# Patient Record
Sex: Female | Born: 1965 | Hispanic: Refuse to answer | Marital: Married | Smoking: Never smoker
Health system: Southern US, Community
[De-identification: ages and names within clinical notes are randomized; demographics above are authoritative.]

## PROBLEM LIST (undated history)

## (undated) DIAGNOSIS — F419 Anxiety disorder, unspecified: Secondary | ICD-10-CM

## (undated) DIAGNOSIS — G43909 Migraine, unspecified, not intractable, without status migrainosus: Secondary | ICD-10-CM

## (undated) DIAGNOSIS — R5383 Other fatigue: Secondary | ICD-10-CM

## (undated) DIAGNOSIS — J45909 Unspecified asthma, uncomplicated: Secondary | ICD-10-CM

## (undated) DIAGNOSIS — R42 Dizziness and giddiness: Secondary | ICD-10-CM

## (undated) DIAGNOSIS — L719 Rosacea, unspecified: Secondary | ICD-10-CM

## (undated) DIAGNOSIS — T7840XA Allergy, unspecified, initial encounter: Secondary | ICD-10-CM

## (undated) DIAGNOSIS — D649 Anemia, unspecified: Secondary | ICD-10-CM

## (undated) DIAGNOSIS — N200 Calculus of kidney: Secondary | ICD-10-CM

## (undated) DIAGNOSIS — E785 Hyperlipidemia, unspecified: Secondary | ICD-10-CM

## (undated) DIAGNOSIS — N390 Urinary tract infection, site not specified: Secondary | ICD-10-CM

## (undated) DIAGNOSIS — F32A Depression, unspecified: Secondary | ICD-10-CM

## (undated) DIAGNOSIS — F329 Major depressive disorder, single episode, unspecified: Secondary | ICD-10-CM

## (undated) HISTORY — DX: Unspecified asthma, uncomplicated: J45.909

## (undated) HISTORY — DX: Urinary tract infection, site not specified: N39.0

## (undated) HISTORY — DX: Allergy, unspecified, initial encounter: T78.40XA

## (undated) HISTORY — DX: Rosacea, unspecified: L71.9

## (undated) HISTORY — DX: Hyperlipidemia, unspecified: E78.5

## (undated) HISTORY — DX: Migraine, unspecified, not intractable, without status migrainosus: G43.909

## (undated) HISTORY — DX: Anemia, unspecified: D64.9

## (undated) HISTORY — DX: Dizziness and giddiness: R42

## (undated) HISTORY — DX: Other fatigue: R53.83

## (undated) HISTORY — PX: BREAST EXCISIONAL BIOPSY: SUR124

## (undated) HISTORY — DX: Calculus of kidney: N20.0

## (undated) HISTORY — DX: Depression, unspecified: F32.A

## (undated) HISTORY — DX: Major depressive disorder, single episode, unspecified: F32.9

## (undated) HISTORY — DX: Anxiety disorder, unspecified: F41.9

## (undated) HISTORY — PX: WISDOM TOOTH EXTRACTION: SHX21

---
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
## 1988-02-18 HISTORY — PX: BREAST BIOPSY: SHX20

## 1999-02-18 HISTORY — PX: LASIK: SHX215

## 2016-05-28 ENCOUNTER — Encounter: Payer: Self-pay | Admitting: Family Medicine

## 2016-05-28 ENCOUNTER — Ambulatory Visit (INDEPENDENT_AMBULATORY_CARE_PROVIDER_SITE_OTHER): Payer: PRIVATE HEALTH INSURANCE | Admitting: Family Medicine

## 2016-05-28 VITALS — BP 116/70 | HR 91 | Temp 98.8°F | Resp 20 | Ht 64.5 in | Wt 137.8 lb

## 2016-05-28 DIAGNOSIS — F410 Panic disorder [episodic paroxysmal anxiety] without agoraphobia: Secondary | ICD-10-CM | POA: Diagnosis not present

## 2016-05-28 DIAGNOSIS — Z7689 Persons encountering health services in other specified circumstances: Secondary | ICD-10-CM | POA: Diagnosis not present

## 2016-05-28 DIAGNOSIS — F419 Anxiety disorder, unspecified: Secondary | ICD-10-CM | POA: Insufficient documentation

## 2016-05-28 DIAGNOSIS — E785 Hyperlipidemia, unspecified: Secondary | ICD-10-CM

## 2016-05-28 DIAGNOSIS — N390 Urinary tract infection, site not specified: Secondary | ICD-10-CM | POA: Diagnosis not present

## 2016-05-28 DIAGNOSIS — F411 Generalized anxiety disorder: Secondary | ICD-10-CM

## 2016-05-28 MED ORDER — PAROXETINE HCL 20 MG PO TABS: 20.0000 mg | ORAL_TABLET | Freq: Every day | ORAL | 0 refills | Status: DC

## 2016-05-28 NOTE — Progress Notes (Signed)
Patient ID: Carla Beltran, female  DOB: 1965-11-17, 51 y.o.   MRN: [id number] Patient Care Team    Relationship Specialty Notifications Start End  [name], DO PCP - General Family Medicine  05/28/16     Subjective:  Carla Beltran is a 51 y.o.  female present for new patient establishment. All past medical history, surgical history, allergies, family history, immunizations, medications and social history were obtained and entered in the electronic medical record today. All recent labs, ED visits and hospitalizations within the last year were reviewed.  Anxiety/phobia: Patient presents for establishment today and admits to increased anxiety and panic attacks. She has fears that she has trouble controlling. She worries about everything. She has a strong fear of driving on the [place], especially in areas she is unfamiliar with. She endorses panic attacks that occur during driving and at other times. She states she experiences a increased heart rate and then feeling like she is "out of her body ". She reports increased anxiety in her life especially around times when she is moving, such as currently. She states she's always had anxiety. She reports she did not have a great childhood. Her mother now has dementia and is in [place] in a nursing facility, this also caused her increased anxiety. Her mother with hoarder. She has 2 daughters, which also are affected by anxiety and are on Celexa. She states her daughters are not affected by his much panic she is. She reports being on Zoloft since 2006. She was initially on 25 mg of Zoloft daily, and eventually increased up to Zoloft 50 mg daily. Prior to moving in August 2017 her prior provider told her to increase to 75 mg daily. She states that she did not like the apathetic side effects she experienced at 75 mg dose, so she decreased back down to the 50 mg dose daily. She reports being seen by counselor many times in the past. She has seek the  help of a Marketing executive in [place] Leanor Rubenstein). She has yet to establish with her.  Hyperlipidemia: Patient reports a history of hyperlipidemia she brings past cholesterol checks from 2002- 2016. She states her cholesterol is high, and she has a lot of anxiety surrounding medication use for cholesterol. She had put off being established and a primary care office after moving, secondary to fear of being started on a statin. She reports taking a fish oil-like substance that was planned paste and during those times her cholesterol was well controlled. Her last cholesterol panel was September 2016 with a total cholesterol 271, HDL 76, LDL 177, triglycerides 89, fasting blood sugar 82. She states she tries to control her cholesterol with her diet.  Frequent UTI: Patient reports she has frequent UTI, and has been prescribed by her prior primary care physician 1 Macrobid 100 mg after intercourse. She feels this is effective for her. She states she does not need refills today, but will call in once needed.  Mood disorder screening: negative.   Depression screen PHQ 2/9 05/28/2016  Decreased Interest 1  Down, Depressed, Hopeless 1  PHQ - 2 Score 2  Altered sleeping 0  Tired, decreased energy 0  Change in appetite 0  Feeling bad or failure about yourself  1  Trouble concentrating 0  Moving slowly or fidgety/restless 0  Suicidal thoughts 0  PHQ-9 Score 3   GAD 7 : Generalized Anxiety Score 05/28/2016  Nervous, Anxious, on Edge 1  Control/stop worrying 1  Worry  too much - different things 1  Trouble relaxing 1  Restless 0  Easily annoyed or irritable 0  Afraid - awful might happen 0  Total GAD 7 Score 4  Anxiety Difficulty Very difficult     Current Exercise Habits: Home exercise routine, Type of exercise: walking, Time (Minutes): 45, Frequency (Times/Week): 4, Weekly Exercise (Minutes/Week): 180, Intensity: Mild    There is no immunization history on file for this  patient.   Past Medical History:  Diagnosis Date  . Anxiety   . Frequent UTI   . Hyperlipidemia   . Migraine   . Vertigo    No Known Allergies Past Surgical History:  Procedure Laterality Date  . BREAST BIOPSY  1990   benign  . TONSILLECTOMY AND ADENOIDECTOMY  1985   Family History  Problem Relation Age of Onset  . Diabetes Mother   . Mental illness Mother     Hoarder  . Dementia Mother   . Atrial fibrillation Father   . Diabetes Maternal Aunt   . Hearing loss Maternal Aunt   . Stroke Maternal Grandmother   . Arthritis Maternal Grandfather   . Heart disease Maternal Grandfather    Social History   Social History  . Marital status: Married    Spouse name: [name] Reading  . Number of children: 2  . Years of education: 14   Occupational History  . [employer]    Social History Main Topics  . Smoking status: Never Smoker  . Smokeless tobacco: Never Used  . Alcohol use No  . Drug use: No  . Sexual activity: Yes    Partners: Male    Birth control/ protection: Surgical     Comment: Married; husband vasectomy   Other Topics Concern  . Not on file   Social History Narrative   Married to [place]. Two adult children [name] and [name] (in college).    Moved from [place] [facility]  area) 09/2015.    SAHM. College educated.    Drinks one cup of coffee a day.   Takes a daily vitamin.   Wears seatbelts, bicycle helmet and smoke detector in the home.   Tries to exercise routinely.   Feels safe in her relationships.   Allergies as of 05/28/2016   No Known Allergies     Medication List       Accurate as of 05/28/16  4:28 PM. Always use your most recent med list.          nitrofurantoin (macrocrystal-monohydrate) 100 MG capsule Commonly known as:  MACROBID Take 100 mg by mouth as needed. 1 capsule after intercourse to prevent UTI   PARoxetine 20 MG tablet Commonly known as:  PAXIL Take 1 tablet (20 mg total) by mouth daily.        No results found for this or any  previous visit (from the past 2160 hour(s)).  Patient was never admitted.  ROS: 14 pt review of systems performed and negative (unless mentioned in an HPI)  Objective: BP 116/70 (BP Location: Right Arm, Patient Position: Sitting, Cuff Size: Normal)   Pulse 91   Temp 98.8 F (37.1 C)   Resp 20   Ht 5' 4.5" (1.638 m)   Wt 137 lb 12 oz (62.5 kg)   SpO2 99%   BMI 23.28 kg/m  Gen: Afebrile. No acute distress. Nontoxic in appearance, well-developed, well-nourished,  Extremely tearful Caucasian female. HENT: AT. [place]. MMM Eyes:Pupils Equal Round Reactive to light, Extraocular movements intact,  Conjunctiva without redness, discharge or  icterus. Neck/lymp/endocrine: Supple, no lymphadenopathy CV: RRR, no edema Chest: CTAB, no wheeze, rhonchi or crackles.  Abd: Soft. NTND. BS present Skin: Warm and well-perfused. Skin intact. Neuro/Msk: Normal gait. PERLA. EOMi. Alert. Oriented x3.   Psych: Patient is extremely tearful  and upset. Has difficulty speaking through tears. Psychomotor agitation present. Severe anxiety. Normal speech. Normal thought content and judgment.   Assessment/plan: Carla Beltran is a 51 y.o. female present for establish care Frequent UTI Patient on Macrobid 100 mg after intercourse. She does not need refills on this today.  Generalized anxiety disorder/panic attacks - Discussed general anxiety and panic attacks in great detail today. Patient is extremely anxious in the office today. Zoloft does not appear to be effective at all for her at this time, and she cannot tolerate increased dose of the medication. Discussed with her many options that we can try for her. Options were discussed and she was allowed to ask many questions, all which were answered for her today. She does not desire to take much medication of any type. She is agreeable to stop Zoloft and start Paxil. - She will establish with the [place] counselor. - Patient was given breathing exercises to attempt  during high anxiety times and when driving if necessary. - PARoxetine (PAXIL) 20 MG tablet; Take 1 tablet (20 mg total) by mouth daily.  Dispense: 30 tablet; Refill: 0 - Follow-up in 4 weeks, will consider increasing Paxil at that time if needed.  Hyperlipidemia, unspecified hyperlipidemia type - Patient has a fear of statin medications. Her last lipid panel was elevated with an LDL of 177. She does have family history. Encouraged her to at least start fish oil supplementation and we will follow-up with her cholesterol during her physical. Would await for the rest of her medical records to be received prior to scheduling physical, if possible.   Return in about 4 weeks (around 06/25/2016) for GAD.  Greater than 45 minutes was spent with patient, greater than 50% of that time was spent face-to-face with patient counseling and/or coordinating care.    Electronically signed by: Howard Pouch, DO [place]

## 2016-05-28 NOTE — Patient Instructions (Signed)
It was a pleasure meeting you today.  I do recommend you get a hold of the christian counselor you spoke about today.  Start paxil 1 pilll daily, stop zoloft.    Generalized Anxiety Disorder, Adult Generalized anxiety disorder (GAD) is a mental health disorder. People with this condition constantly worry about everyday events. Unlike normal anxiety, worry related to GAD is not triggered by a specific event. These worries also do not fade or get better with time. GAD interferes with life functions, including relationships, work, and school. GAD can vary from mild to severe. People with severe GAD can have intense waves of anxiety with physical symptoms (panic attacks). What are the causes? The exact cause of GAD is not known. What increases the risk? This condition is more likely to develop in:  Women.  People who have a family history of anxiety disorders.  People who are very shy.  People who experience very stressful life events, such as the death of a loved one.  People who have a very stressful family environment. What are the signs or symptoms? People with GAD often worry excessively about many things in their lives, such as their health and family. They may also be overly concerned about:  Doing well at work.  Being on time.  Natural disasters.  Friendships. Physical symptoms of GAD include:  Fatigue.  Muscle tension or having muscle twitches.  Trembling or feeling shaky.  Being easily startled.  Feeling like your heart is pounding or racing.  Feeling out of breath or like you cannot take a deep breath.  Having trouble falling asleep or staying asleep.  Sweating.  Nausea, diarrhea, or irritable bowel syndrome (IBS).  Headaches.  Trouble concentrating or remembering facts.  Restlessness.  Irritability. How is this diagnosed? Your health care provider can diagnose GAD based on your symptoms and medical history. You will also have a physical exam. The  health care provider will ask specific questions about your symptoms, including how severe they are, when they started, and if they come and go. Your health care provider may ask you about your use of alcohol or drugs, including prescription medicines. Your health care provider may refer you to a mental health specialist for further evaluation. Your health care provider will do a thorough examination and may perform additional tests to rule out other possible causes of your symptoms. To be diagnosed with GAD, a person must have anxiety that:  Is out of his or her control.  Affects several different aspects of his or her life, such as work and relationships.  Causes distress that makes him or her unable to take part in normal activities.  Includes at least three physical symptoms of GAD, such as restlessness, fatigue, trouble concentrating, irritability, muscle tension, or sleep problems. Before your health care provider can confirm a diagnosis of GAD, these symptoms must be present more days than they are not, and they must last for six months or longer. How is this treated? The following therapies are usually used to treat GAD:  Medicine. Antidepressant medicine is usually prescribed for long-term daily control. Antianxiety medicines may be added in severe cases, especially when panic attacks occur.  Talk therapy (psychotherapy). Certain types of talk therapy can be helpful in treating GAD by providing support, education, and guidance. Options include:  Cognitive behavioral therapy (CBT). People learn coping skills and techniques to ease their anxiety. They learn to identify unrealistic or negative thoughts and behaviors and to replace them with positive ones.  Acceptance and commitment therapy (ACT). This treatment teaches people how to be mindful as a way to cope with unwanted thoughts and feelings.  Biofeedback. This process trains you to manage your body's response (physiological  response) through breathing techniques and relaxation methods. You will work with a therapist while machines are used to monitor your physical symptoms.  Stress management techniques. These include yoga, meditation, and exercise. A mental health specialist can help determine which treatment is best for you. Some people see improvement with one type of therapy. However, other people require a combination of therapies. Follow these instructions at home:  Take over-the-counter and prescription medicines only as told by your health care provider.  Try to maintain a normal routine.  Try to anticipate stressful situations and allow extra time to manage them.  Practice any stress management or self-calming techniques as taught by your health care provider.  Do not punish yourself for setbacks or for not making progress.  Try to recognize your accomplishments, even if they are small.  Keep all follow-up visits as told by your health care provider. This is important. Contact a health care provider if:  Your symptoms do not get better.  Your symptoms get worse.  You have signs of depression, such as:  A persistently sad, cranky, or irritable mood.  Loss of enjoyment in activities that used to bring you joy.  Change in weight or eating.  Changes in sleeping habits.  Avoiding friends or family members.  Loss of energy for normal tasks.  Feelings of guilt or worthlessness. Get help right away if:  You have serious thoughts about hurting yourself or others. If you ever feel like you may hurt yourself or others, or have thoughts about taking your own life, get help right away. You can go to your nearest emergency department or call:  Your local emergency services (911 in the [place]  A suicide crisis helpline, such as the [facility] at 410-515-3060. This is open 24 hours a day. Summary  Generalized anxiety disorder (GAD) is a mental health disorder that  involves worry that is not triggered by a specific event.  People with GAD often worry excessively about many things in their lives, such as their health and family.  GAD may cause physical symptoms such as restlessness, trouble concentrating, sleep problems, frequent sweating, nausea, diarrhea, headaches, and trembling or muscle twitching.  A mental health specialist can help determine which treatment is best for you. Some people see improvement with one type of therapy. However, other people require a combination of therapies. This information is not intended to replace advice given to you by your health care provider. Make sure you discuss any questions you have with your health care provider. Document Released: 05/31/2012 Document Revised: 12/25/2015 Document Reviewed: 12/25/2015 Elsevier Interactive Patient Education  2017 [employer].

## 2016-06-18 ENCOUNTER — Ambulatory Visit: Payer: PRIVATE HEALTH INSURANCE | Admitting: Family Medicine

## 2016-06-25 ENCOUNTER — Ambulatory Visit (INDEPENDENT_AMBULATORY_CARE_PROVIDER_SITE_OTHER): Payer: PRIVATE HEALTH INSURANCE | Admitting: Family Medicine

## 2016-06-25 ENCOUNTER — Encounter: Payer: Self-pay | Admitting: Family Medicine

## 2016-06-25 VITALS — BP 113/70 | HR 78 | Temp 98.1°F | Resp 20 | Ht 65.0 in | Wt 136.8 lb

## 2016-06-25 DIAGNOSIS — F410 Panic disorder [episodic paroxysmal anxiety] without agoraphobia: Secondary | ICD-10-CM

## 2016-06-25 DIAGNOSIS — F411 Generalized anxiety disorder: Secondary | ICD-10-CM

## 2016-06-25 MED ORDER — PAROXETINE HCL 20 MG PO TABS: 20.0000 mg | ORAL_TABLET | Freq: Every day | ORAL | 0 refills | Status: DC

## 2016-06-25 NOTE — Progress Notes (Signed)
Patient ID: Carla Beltran, female  DOB: 08/29/1965, 51 y.o.   MRN: [id number] Patient Care Team    Relationship Specialty Notifications Start End  [name], DO PCP - General Family Medicine  05/28/16     Subjective:  Carla Beltran is a 51 y.o.  female present for follow up on   Chief Complaint  Patient presents with  . Anxiety     Anxiety/phobia:  Patient doing well on Paxil 20 mg QD. She denies any side effects except dizziness, that resolved and decrease in sexual desire (which she is ok with). She feels her emotions and anxiety are much improved on the medication. She would like to continue at current dose.   Prior note:  Patient presents for establishment today and admits to increased anxiety and panic attacks. She has fears that she has trouble controlling. She worries about everything. She has a strong fear of driving on the [place], especially in areas she is unfamiliar with. She endorses panic attacks that occur during driving and at other times. She states she experiences a increased heart rate and then feeling like she is "out of her body ". She reports increased anxiety in her life especially around times when she is moving, such as currently. She states she's always had anxiety. She reports she did not have a great childhood. Her mother now has dementia and is in [place] in a nursing facility, this also caused her increased anxiety. Her mother with hoarder. She has 2 daughters, which also are affected by anxiety and are on Celexa. She states her daughters are not affected by his much panic she is. She reports being on Zoloft since 2006. She was initially on 25 mg of Zoloft daily, and eventually increased up to Zoloft 50 mg daily. Prior to moving in August 2017 her prior provider told her to increase to 75 mg daily. She states that she did not like the apathetic side effects she experienced at 75 mg dose, so she decreased back down to the 50 mg dose daily. She  reports being seen by counselor many times in the past. She has seek the help of a Marketing executive in [place] Leanor Rubenstein). She has yet to establish with her.  Mood disorder screening: negative.   Depression screen [name] 2/9 06/25/2016 05/28/2016  Decreased Interest 0 1  Down, Depressed, Hopeless 0 1  PHQ - 2 Score 0 2  Altered sleeping - 0  Tired, decreased energy - 0  Change in appetite - 0  Feeling bad or failure about yourself  - 1  Trouble concentrating - 0  Moving slowly or fidgety/restless - 0  Suicidal thoughts - 0  PHQ-9 Score - 3   GAD 7 : Generalized Anxiety Score 05/28/2016  Nervous, Anxious, on Edge 1  Control/stop worrying 1  Worry too much - different things 1  Trouble relaxing 1  Restless 0  Easily annoyed or irritable 0  Afraid - awful might happen 0  Total GAD 7 Score 4  Anxiety Difficulty Very difficult         Immunization History  Administered Date(s) Administered  . Tdap 10/26/2012     Past Medical History:  Diagnosis Date  . Anxiety   . Frequent UTI   . Hyperlipidemia   . Migraine   . Vertigo    No Known Allergies Past Surgical History:  Procedure Laterality Date  . BREAST BIOPSY  1990   benign  . TONSILLECTOMY AND ADENOIDECTOMY  1985   Family History  Problem Relation Age of Onset  . Diabetes Mother   . Mental illness Mother     Hoarder  . Dementia Mother   . Atrial fibrillation Father   . Diabetes Maternal Aunt   . Hearing loss Maternal Aunt   . Stroke Maternal Grandmother   . Arthritis Maternal Grandfather   . Heart disease Maternal Grandfather    Social History   Social History  . Marital status: Married    Spouse name: [name] Reading  . Number of children: 2  . Years of education: 14   Occupational History  . [name]    Social History Main Topics  . Smoking status: Never Smoker  . Smokeless tobacco: Never Used  . Alcohol use No  . Drug use: No  . Sexual activity: Yes    Partners: Male    Birth control/  protection: Surgical     Comment: Married; husband vasectomy   Other Topics Concern  . Not on file   Social History Narrative   Married to [place]. Two adult children Carla Beltran and Carla Beltran (in college).    Moved from [place] [facility]  area) 09/2015.    SAHM. College educated.    Drinks one cup of coffee a day.   Takes a daily vitamin.   Wears seatbelts, bicycle helmet and smoke detector in the home.   Tries to exercise routinely.   Feels safe in her relationships.   Allergies as of 06/25/2016   No Known Allergies     Medication List       Accurate as of 06/25/16  2:15 PM. Always use your most recent med list.          nitrofurantoin (macrocrystal-monohydrate) 100 MG capsule Commonly known as:  MACROBID Take 100 mg by mouth as needed. 1 capsule after intercourse to prevent UTI   PARoxetine 20 MG tablet Commonly known as:  PAXIL Take 1 tablet (20 mg total) by mouth daily.        No results found for this or any previous visit (from the past 2160 hour(s)).  Patient was never admitted.  ROS: 14 pt review of systems performed and negative (unless mentioned in an HPI)  Objective: BP 113/70 (BP Location: Right Arm, Patient Position: Sitting, Cuff Size: Normal)   Pulse 78   Temp 98.1 F (36.7 C)   Resp 20   Ht 5\' 5"  (1.651 m)   Wt 136 lb 12 oz (62 kg)   SpO2 98%   BMI 22.76 kg/m   Gen: Afebrile. No acute distress.  HENT: AT. [place]. MMM Eyes:Pupils Equal Round Reactive to light, Extraocular movements intact,  Conjunctiva without redness, discharge or icterus. Psych: Normal affect, dress and demeanor. Normal speech. Normal thought content and judgment.  Assessment/plan: Edona Schreffler is a 51 y.o. female present for establish care Generalized anxiety disorder/panic attacks - doing well with Paxil 20 mg QD.  - PARoxetine (PAXIL) 20 MG tablet; Take 1 tablet (20 mg total) by mouth daily.  Dispense: 90 tablet; Refill: 0 - continue Marketing executive. - Patient was given  breathing exercises to attempt during high anxiety times and when driving if necessary. - F/U 3 months (then can see every 6 mos)- can schedule at CPE as long as doing ok.   Return in about 3 months (around 09/25/2016) for CPE.   Electronically signed by: Howard Pouch, DO [place]

## 2016-06-25 NOTE — Patient Instructions (Addendum)
You look great! I am so glad you are doing better.  Follow up in 3  Months for CPE and if doing ok can cover the anxiety at that time.     Please help [place] help you:  We are honored you have chosen [facility] for your Primary Care home. Below you will find basic instructions that you may need to access in the future. Please help [place] help you by reading the instructions, which cover many of the frequent questions we experience.   Prescription refills and request:  -In order to allow more efficient response time, please call your pharmacy for all refills. They will forward the request electronically to [place]. This allows for the quickest possible response. Request left on a nurse line can take longer to refill, since these are checked as time allows between office patients and other phone calls.  - refill request can take up to 3-5 working days to complete.  - If request is sent electronically and request is appropiate, it is usually completed in 1-2 business days.  - all patients will need to be seen routinely for all chronic medical conditions requiring prescription medications (see follow-up below). If you are overdue for follow up on your condition, you will be asked to make an appointment and we will call in enough medication to cover you until your appointment (up to 30 days).  - all controlled substances will require a face to face visit to request/refill.  - if you desire your prescriptions to go through a new pharmacy, and have an active script at original pharmacy, you will need to call your pharmacy and have scripts transferred to new pharmacy. This is completed between the pharmacy locations and not by your provider.    Results: If any images or labs were ordered, it can take up to 1 week to get results depending on the test ordered and the lab/facility running and resulting the test. - Normal or stable results, which do not need further discussion, may be released to your mychart  immediately with attached note to you. A call may not be generated for normal results. Please make certain to sign up for mychart. If you have questions on how to activate your mychart you can call the front office.  - If your results need further discussion, our office will attempt to contact you via phone, and if unable to reach you after 2 attempts, we will release your abnormal result to your mychart with instructions.  - All results will be automatically released in mychart after 1 week.  - Your provider will provide you with explanation and instruction on all relevant material in your results. Please keep in mind, results and labs may appear confusing or abnormal to the untrained eye, but it does not mean they are actually abnormal for you personally. If you have any questions about your results that are not covered, or you desire more detailed explanation than what was provided, you should make an appointment with your provider to do so.   Our office handles many outgoing and incoming calls daily. If we have not contacted you within 1 week about your results, please check your mychart to see if there is a message first and if not, then contact our office.  In helping with this matter, you help decrease call volume, and therefore allow [place] to be able to respond to patients needs more efficiently.   Acute office visits (sick visit):  An acute visit is intended for  a new problem and are scheduled in shorter time slots to allow schedule openings for patients with new problems. This is the appropriate visit to discuss a new problem. In order to provide you with excellent quality medical care with proper time for you to explain your problem, have an exam and receive treatment with instructions, these appointments should be limited to one new problem per visit. If you experience a new problem, in which you desire to be addressed, please make an acute office visit, we save openings on the schedule to  accommodate you. Please do not save your new problem for any other type of visit, let us take care of it properly and quickly for you.   Follow up visits:  Depending on your condition(s) your provider will need to see you routinely in order to provide you with quality care and prescribe medication(s). Most chronic conditions (Example: hypertension, Diabetes, depression/anxiety... etc), require visits a couple times a year. Your provider will instruct you on proper follow up for your personal medical conditions and history. Please make certain to make follow up appointments for your condition as instructed. Failing to do so could result in lapse in your medication treatment/refills. If you request a refill, and are overdue to be seen on a condition, we will always provide you with a 30 day script (once) to allow you time to schedule.    Medicare wellness (well visit): - we have a wonderful Nurse Maudie Mercury), that will meet with you and provide you will yearly medicare wellness visits. These visits should occur yearly (can not be scheduled less than 1 calendar year apart) and cover preventive health, immunizations, advance directives and screenings you are entitled to yearly through your medicare benefits. Do not miss out on your entitled benefits, this is when medicare will pay for these benefits to be ordered for you.  These are strongly encouraged by your provider and is the appropriate type of visit to make certain you are up to date with all preventive health benefits. If you have not had your medicare wellness exam in the last 12 months, please make certain to schedule one by calling the office and schedule your medicare wellness with Maudie Mercury as soon as possible.   Yearly physical (well visit):  - Adults are recommended to be seen yearly for physicals. Check with your insurance and date of your last physical, most insurances require one calendar year between physicals. Physicals include all preventive health  topics, screenings, medical exam and labs that are appropriate for gender/age and history. You may have fasting labs needed at this visit. This is a well visit (not a sick visit), new problems should not be covered during this visit (see acute visit).  - Pediatric patients are seen more frequently when they are younger. Your provider will advise you on well child visit timing that is appropriate for your their age. - This is not a medicare wellness visit. Medicare wellness exams do not have an exam portion to the visit. Some medicare companies allow for a physical, some do not allow a yearly physical. If your medicare allows a yearly physical you can schedule the medicare wellness with our nurse Maudie Mercury and have your physical with your provider after, on the same day. Please check with insurance for your full benefits.   Late Policy/No Shows:  - all new patients should arrive 15-30 minutes earlier than appointment to allow [place] time  to  obtain all personal demographics,  insurance information and for you to  complete office paperwork. - All established patients should arrive 10-15 minutes earlier than appointment time to update all information and be checked in .  - In our best efforts to run on time, if you are late for your appointment you will be asked to either reschedule or if able, we will work you back into the schedule. There will be a wait time to work you back in the schedule,  depending on availability.  - If you are unable to make it to your appointment as scheduled, please call 24 hours ahead of time to allow [place] to fill the time slot with someone else who needs to be seen. If you do not cancel your appointment ahead of time, you may be charged a no show fee.

## 2016-06-27 ENCOUNTER — Encounter: Payer: Self-pay | Admitting: Family Medicine

## 2016-07-28 ENCOUNTER — Ambulatory Visit: Payer: PRIVATE HEALTH INSURANCE | Admitting: Family Medicine

## 2016-07-28 ENCOUNTER — Ambulatory Visit (INDEPENDENT_AMBULATORY_CARE_PROVIDER_SITE_OTHER): Payer: 59 | Admitting: Family Medicine

## 2016-07-28 ENCOUNTER — Encounter: Payer: Self-pay | Admitting: Family Medicine

## 2016-07-28 VITALS — BP 123/77 | HR 81 | Temp 97.9°F | Resp 16 | Wt 132.0 lb

## 2016-07-28 DIAGNOSIS — R233 Spontaneous ecchymoses: Secondary | ICD-10-CM | POA: Insufficient documentation

## 2016-07-28 DIAGNOSIS — N390 Urinary tract infection, site not specified: Secondary | ICD-10-CM

## 2016-07-28 DIAGNOSIS — R238 Other skin changes: Secondary | ICD-10-CM | POA: Insufficient documentation

## 2016-07-28 LAB — PROTIME-INR
INR: 1 ratio (ref 0.8–1.0)
Prothrombin Time: 11.2 s (ref 9.6–13.1)

## 2016-07-28 LAB — CBC WITH DIFFERENTIAL/PLATELET
Basophils Absolute: 0.1 10*3/uL (ref 0.0–0.1)
Basophils Relative: 1 % (ref 0.0–3.0)
EOS PCT: 1.3 % (ref 0.0–5.0)
Eosinophils Absolute: 0.1 10*3/uL (ref 0.0–0.7)
HCT: 38.9 % (ref 36.0–46.0)
HEMOGLOBIN: 13.3 g/dL (ref 12.0–15.0)
LYMPHS ABS: 1.9 10*3/uL (ref 0.7–4.0)
Lymphocytes Relative: 32.7 % (ref 12.0–46.0)
MCHC: 34 g/dL (ref 30.0–36.0)
MCV: 90.8 fl (ref 78.0–100.0)
MONO ABS: 0.5 10*3/uL (ref 0.1–1.0)
Monocytes Relative: 8.9 % (ref 3.0–12.0)
NEUTROS PCT: 56.1 % (ref 43.0–77.0)
Neutro Abs: 3.2 10*3/uL (ref 1.4–7.7)
Platelets: 305 10*3/uL (ref 150.0–400.0)
RBC: 4.29 Mil/uL (ref 3.87–5.11)
RDW: 12.9 % (ref 11.5–15.5)
WBC: 5.8 10*3/uL (ref 4.0–10.5)

## 2016-07-28 LAB — COMPREHENSIVE METABOLIC PANEL
ALBUMIN: 4.5 g/dL (ref 3.5–5.2)
ALK PHOS: 64 U/L (ref 39–117)
ALT: 15 U/L (ref 0–35)
AST: 19 U/L (ref 0–37)
BUN: 13 mg/dL (ref 6–23)
CO2: 29 mEq/L (ref 19–32)
CREATININE: 0.69 mg/dL (ref 0.40–1.20)
Calcium: 10 mg/dL (ref 8.4–10.5)
Chloride: 102 mEq/L (ref 96–112)
GFR: 95.18 mL/min (ref >60.00)
GLUCOSE: 82 mg/dL (ref 70–99)
Potassium: 4.4 mEq/L (ref 3.5–5.1)
SODIUM: 139 meq/L (ref 135–145)
TOTAL PROTEIN: 6.4 g/dL (ref 6.0–8.3)
Total Bilirubin: 0.5 mg/dL (ref 0.2–1.2)

## 2016-07-28 LAB — APTT: aPTT: 32.8 s — ABNORMAL HIGH (ref 23.4–32.7)

## 2016-07-28 LAB — TSH: TSH: 1.68 u[IU]/mL (ref 0.35–4.50)

## 2016-07-28 MED ORDER — NITROFURANTOIN MONOHYD MACRO 100 MG PO CAPS: 100.0000 mg | ORAL_CAPSULE | ORAL | 1 refills | Status: DC | PRN

## 2016-07-28 NOTE — Progress Notes (Signed)
Carla Beltran , 1966/01/20, 50 y.o., female MRN: [id number] Patient Care Team    Relationship Specialty Notifications Start End  [name], DO PCP - General Family Medicine  05/28/16     Chief Complaint  Patient presents with  . Bleeding/Bruising    Bruising on bilateral leg     Subjective: Pt presents for an OV with complaints of easy bruising of few weeks duration.  Patient states she has noticed multiple bruises over both of her legs, one on her arm. She does endorse moving her daughter out her home and setting up for her daughters wedding over this time. She had lifted and moved many heavy objects over this time. She denies OTC supplements or frequent use NSAIDS. She denies unintentional weight loss, fatigue or night sweats. She is postmenopausal. She denies BRBPR/hematochezia or gum bleeding. She denies prior h/o or fhx of blood disorder.  She was started on SSRI, paxil 2 months ago.  She has h/o frequent UTI and is prescribe macrobid, which she states she had a UTI and was taking BID for 7 days during the wedding/moving time.  She also noticed a lump in her left flank and wants to know if it suppose to be there.  Depression screen [facility] 2/9 06/25/2016 05/28/2016  Decreased Interest 0 1  Down, Depressed, Hopeless 0 1  PHQ - 2 Score 0 2  Altered sleeping - 0  Tired, decreased energy - 0  Change in appetite - 0  Feeling bad or failure about yourself  - 1  Trouble concentrating - 0  Moving slowly or fidgety/restless - 0  Suicidal thoughts - 0  PHQ-9 Score - 3    No Known Allergies Social History  Substance Use Topics  . Smoking status: Never Smoker  . Smokeless tobacco: Never Used  . Alcohol use No   Past Medical History:  Diagnosis Date  . Anxiety   . Fatigue   . Frequent UTI   . Hyperlipidemia   . Migraine   . Reactive airway disease   . Renal stones   . Rosacea   . Vertigo    was on antivert   Past Surgical History:  Procedure Laterality Date  . BREAST  BIOPSY  1990   benign  . LASIK  2001  . TONSILLECTOMY AND ADENOIDECTOMY  1985   Family History  Problem Relation Age of Onset  . Diabetes Mother   . Mental illness Mother        Hoarder  . Dementia Mother   . Asthma Mother   . Depression Mother   . Migraines Mother   . Atrial fibrillation Father   . Hypertension Father   . Diabetes Maternal Aunt   . Hearing loss Maternal Aunt   . Stroke Maternal Grandmother   . Arthritis Maternal Grandfather   . Heart disease Maternal Grandfather    Allergies as of 07/28/2016   No Known Allergies     Medication List       Accurate as of 07/28/16  1:39 PM. Always use your most recent med list.          nitrofurantoin (macrocrystal-monohydrate) 100 MG capsule Commonly known as:  MACROBID Take 1 capsule (100 mg total) by mouth as needed. 1 capsule after intercourse to prevent UTI   PARoxetine 20 MG tablet Commonly known as:  PAXIL Take 1 tablet (20 mg total) by mouth daily.       All past medical history, surgical history, allergies, family history, immunizations  andmedications were updated in the EMR today and reviewed under the history and medication portions of their EMR.     ROS: Negative, with the exception of above mentioned in HPI   Objective:  BP 123/77 (BP Location: Right Arm, Patient Position: Sitting, Cuff Size: Normal)   Pulse 81   Temp 97.9 F (36.6 C) (Oral)   Resp 16   Wt 132 lb (59.9 kg)   SpO2 99%   BMI 21.97 kg/m  Body mass index is 21.97 kg/m. Gen: Afebrile. No acute distress. Nontoxic in appearance, well developed, well nourished. Very anxious female.  HENT: AT. [place].  MMM Eyes:Pupils Equal Round Reactive to light, Extraocular movements intact,  Conjunctiva without redness, discharge or icterus. Neck/lymp/endocrine: Supple,no lymphadenopathy CV: RRR, no edema Chest: CTAB, no wheeze or crackles. Good air movement, normal resp effort.  Abd: Soft. flat. NTND. BS present. no Masses palpated. No rebound or  guarding.  Skin: no rashes, purpura or petechiae. Multiple small bruises, yellow and fading over lower ext. Bilateral, 1 left bicep area, 1 left back area.  Neuro: Normal gait. PERLA. EOMi. Alert. Oriented x3  Psych: very anxious. Mild rapid speech. Normal affect, dress and demeanor. Normal speech. Pt is self aware she over thinks medical symptoms.   No exam data present No results found. No results found for this or any previous visit (from the past 24 hour(s)).  Assessment/Plan: Carla Beltran is a 51 y.o. female present for OV for  Easy bruising - new - discussed with pt this is more than likely normal bruising she is experiencing .She is an extremely anxious personality. Will collect labs today. She is to avoid NSAIDS.  - Potential cause from SSRI, but she also moved her daughter, all bruises look in the same phase. Mostly all lower ext.  - CBC w/Diff - Comp Met (CMET) - INR/PT - PTT - TSH  Frequent UTI - new.  - prior pcp had on Macrobid QHS after intercourse. Pt states she was practically taking a few times a week, but was missing doses.  - refilled macrobid for her today. Encouraged nightly macrobid for proph. Discussed tx when needed of 1 tab every 12 hours bid for 7 days.  abd mass- she was concerned about is the end of her rib on the left side. Pointed out she has one on the other side as well and had pt palpate in the area in the  Office today and she was relieved. Reassured.   - f/u dependent on lab resutls    Reviewed expectations re: course of current medical issues.  Discussed self-management of symptoms.  Outlined signs and symptoms indicating need for more acute intervention.  Patient verbalized understanding and all questions were answered.  Patient received an After-Visit Summary.   Note is dictated utilizing voice recognition software. Although note has been proof read prior to signing, occasional typographical errors still can be missed. If any questions  arise, please do not hesitate to call for verification.   electronically signed by:  Howard Pouch, DO  [place]

## 2016-07-28 NOTE — Patient Instructions (Signed)
I will call you after we receive your labs. I suspect this is all ok.   Avoid chronic advil/tumeric/ginko    Please help [place] help you:  We are honored you have chosen [facility] for your Primary Care home. Below you will find basic instructions that you may need to access in the future. Please help [place] help you by reading the instructions, which cover many of the frequent questions we experience.   Prescription refills and request:  -In order to allow more efficient response time, please call your pharmacy for all refills. They will forward the request electronically to [place]. This allows for the quickest possible response. Request left on a nurse line can take longer to refill, since these are checked as time allows between office patients and other phone calls.  - refill request can take up to 3-5 working days to complete.  - If request is sent electronically and request is appropiate, it is usually completed in 1-2 business days.  - all patients will need to be seen routinely for all chronic medical conditions requiring prescription medications (see follow-up below). If you are overdue for follow up on your condition, you will be asked to make an appointment and we will call in enough medication to cover you until your appointment (up to 30 days).  - all controlled substances will require a face to face visit to request/refill.  - if you desire your prescriptions to go through a new pharmacy, and have an active script at original pharmacy, you will need to call your pharmacy and have scripts transferred to new pharmacy. This is completed between the pharmacy locations and not by your provider.    Results: If any images or labs were ordered, it can take up to 1 week to get results depending on the test ordered and the lab/facility running and resulting the test. - Normal or stable results, which do not need further discussion, may be released to your mychart immediately with attached note to  you. A call may not be generated for normal results. Please make certain to sign up for mychart. If you have questions on how to activate your mychart you can call the front office.  - If your results need further discussion, our office will attempt to contact you via phone, and if unable to reach you after 2 attempts, we will release your abnormal result to your mychart with instructions.  - All results will be automatically released in mychart after 1 week.  - Your provider will provide you with explanation and instruction on all relevant material in your results. Please keep in mind, results and labs may appear confusing or abnormal to the untrained eye, but it does not mean they are actually abnormal for you personally. If you have any questions about your results that are not covered, or you desire more detailed explanation than what was provided, you should make an appointment with your provider to do so.   Our office handles many outgoing and incoming calls daily. If we have not contacted you within 1 week about your results, please check your mychart to see if there is a message first and if not, then contact our office.  In helping with this matter, you help decrease call volume, and therefore allow [place] to be able to respond to patients needs more efficiently.   Acute office visits (sick visit):  An acute visit is intended for a new problem and are scheduled in shorter time slots to allow  schedule openings for patients with new problems. This is the appropriate visit to discuss a new problem. In order to provide you with excellent quality medical care with proper time for you to explain your problem, have an exam and receive treatment with instructions, these appointments should be limited to one new problem per visit. If you experience a new problem, in which you desire to be addressed, please make an acute office visit, we save openings on the schedule to accommodate you. Please do not save your  new problem for any other type of visit, let us take care of it properly and quickly for you.   Follow up visits:  Depending on your condition(s) your provider will need to see you routinely in order to provide you with quality care and prescribe medication(s). Most chronic conditions (Example: hypertension, Diabetes, depression/anxiety... etc), require visits a couple times a year. Your provider will instruct you on proper follow up for your personal medical conditions and history. Please make certain to make follow up appointments for your condition as instructed. Failing to do so could result in lapse in your medication treatment/refills. If you request a refill, and are overdue to be seen on a condition, we will always provide you with a 30 day script (once) to allow you time to schedule.    Medicare wellness (well visit): - we have a wonderful Nurse Maudie Mercury), that will meet with you and provide you will yearly medicare wellness visits. These visits should occur yearly (can not be scheduled less than 1 calendar year apart) and cover preventive health, immunizations, advance directives and screenings you are entitled to yearly through your medicare benefits. Do not miss out on your entitled benefits, this is when medicare will pay for these benefits to be ordered for you.  These are strongly encouraged by your provider and is the appropriate type of visit to make certain you are up to date with all preventive health benefits. If you have not had your medicare wellness exam in the last 12 months, please make certain to schedule one by calling the office and schedule your medicare wellness with Maudie Mercury as soon as possible.   Yearly physical (well visit):  - Adults are recommended to be seen yearly for physicals. Check with your insurance and date of your last physical, most insurances require one calendar year between physicals. Physicals include all preventive health topics, screenings, medical exam and labs  that are appropriate for gender/age and history. You may have fasting labs needed at this visit. This is a well visit (not a sick visit), new problems should not be covered during this visit (see acute visit).  - Pediatric patients are seen more frequently when they are younger. Your provider will advise you on well child visit timing that is appropriate for your their age. - This is not a medicare wellness visit. Medicare wellness exams do not have an exam portion to the visit. Some medicare companies allow for a physical, some do not allow a yearly physical. If your medicare allows a yearly physical you can schedule the medicare wellness with our nurse Maudie Mercury and have your physical with your provider after, on the same day. Please check with insurance for your full benefits.   Late Policy/No Shows:  - all new patients should arrive 15-30 minutes earlier than appointment to allow [place] time  to  obtain all personal demographics,  insurance information and for you to complete office paperwork. - All established patients should arrive 10-15 minutes earlier  than appointment time to update all information and be checked in .  - In our best efforts to run on time, if you are late for your appointment you will be asked to either reschedule or if able, we will work you back into the schedule. There will be a wait time to work you back in the schedule,  depending on availability.  - If you are unable to make it to your appointment as scheduled, please call 24 hours ahead of time to allow [place] to fill the time slot with someone else who needs to be seen. If you do not cancel your appointment ahead of time, you may be charged a no show fee.

## 2016-07-29 ENCOUNTER — Telehealth: Payer: Self-pay | Admitting: Family Medicine

## 2016-07-29 NOTE — Telephone Encounter (Signed)
Spoke with patient reviewed lab results. 

## 2016-07-29 NOTE — Telephone Encounter (Signed)
Please call pt:  her labs are normal. Her bruising is likely just from all the moving and lifting she was doing.

## 2016-08-25 ENCOUNTER — Ambulatory Visit (HOSPITAL_BASED_OUTPATIENT_CLINIC_OR_DEPARTMENT_OTHER)
Admission: RE | Admit: 2016-08-25 | Discharge: 2016-08-25 | Disposition: A | Discharge status: Home / Self Care | Payer: 59 | Source: Ambulatory Visit | Attending: Family Medicine | Admitting: Family Medicine

## 2016-08-25 ENCOUNTER — Encounter: Payer: Self-pay | Admitting: Family Medicine

## 2016-08-25 ENCOUNTER — Ambulatory Visit (INDEPENDENT_AMBULATORY_CARE_PROVIDER_SITE_OTHER): Payer: 59 | Admitting: Family Medicine

## 2016-08-25 ENCOUNTER — Ambulatory Visit: Payer: 59 | Admitting: Family Medicine

## 2016-08-25 VITALS — BP 127/80 | HR 82 | Temp 98.1°F | Resp 20 | Wt 135.5 lb

## 2016-08-25 DIAGNOSIS — Z131 Encounter for screening for diabetes mellitus: Secondary | ICD-10-CM | POA: Diagnosis not present

## 2016-08-25 DIAGNOSIS — E785 Hyperlipidemia, unspecified: Secondary | ICD-10-CM

## 2016-08-25 DIAGNOSIS — R2242 Localized swelling, mass and lump, left lower limb: Secondary | ICD-10-CM | POA: Insufficient documentation

## 2016-08-25 DIAGNOSIS — M79672 Pain in left foot: Secondary | ICD-10-CM | POA: Diagnosis not present

## 2016-08-25 NOTE — Patient Instructions (Addendum)
Please have xray done today at [facility]. [facility].   Ganglion Cyst A ganglion cyst is a noncancerous, fluid-filled lump that occurs near joints or tendons. The ganglion cyst grows out of a joint or the lining of a tendon. It most often develops in the hand or wrist, but it can also develop in the shoulder, elbow, hip, knee, ankle, or foot. The round or oval ganglion cyst can be the size of a pea or larger than a grape. Increased activity may enlarge the size of the cyst because more fluid starts to build up. What are the causes? It is not known what causes a ganglion cyst to grow. However, it may be related to:  Inflammation or irritation around the joint.  An injury.  Repetitive movements or overuse.  Arthritis.  What increases the risk? Risk factors include:  Being a woman.  Being age 81-50.  What are the signs or symptoms? Symptoms may include:  A lump. This most often appears on the hand or wrist, but it can occur in other areas of the body.  Tingling.  Pain.  Numbness.  Muscle weakness.  Weak grip.  Less movement in a joint.  How is this diagnosed? Ganglion cysts are most often diagnosed based on a physical exam. Your health care provider will feel the lump and may shine a light alongside it. If it is a ganglion cyst, a light often shines through it. Your health care provider may order an X-ray, ultrasound, or MRI to rule out other conditions. How is this treated? Ganglion cysts usually go away on their own without treatment. If pain or other symptoms are involved, treatment may be needed. Treatment is also needed if the ganglion cyst limits your movement or if it gets infected. Treatment may include:  Wearing a brace or splint on your wrist or finger.  Taking anti-inflammatory medicine.  Draining fluid from the lump with a needle (aspiration).  Injecting a steroid into the joint.  Surgery to remove the ganglion cyst.  Follow these  instructions at home:  Do not press on the ganglion cyst, poke it with a needle, or hit it.  Take medicines only as directed by your health care provider.  Wear your brace or splint as directed by your health care provider.  Watch your ganglion cyst for any changes.  Keep all follow-up visits as directed by your health care provider. This is important. Contact a health care provider if:  Your ganglion cyst becomes larger or more painful.  You have increased redness, red streaks, or swelling.  You have pus coming from the lump.  You have weakness or numbness in the affected area.  You have a fever or chills. This information is not intended to replace advice given to you by your health care provider. Make sure you discuss any questions you have with your health care provider. Document Released: 02/01/2000 Document Revised: 07/12/2015 Document Reviewed: 07/19/2013 Elsevier Interactive Patient Education  2018 [employer].       Please help [place] help you:  We are honored you have chosen [facility] for your Primary Care home. Below you will find basic instructions that you may need to access in the future. Please help [place] help you by reading the instructions, which cover many of the frequent questions we experience.   Prescription refills and request:  -In order to allow more efficient response time, please call your pharmacy for all refills. They will forward the request electronically to  [place]. This allows for the quickest possible response. Request left on a nurse line can take longer to refill, since these are checked as time allows between office patients and other phone calls.  - refill request can take up to 3-5 working days to complete.  - If request is sent electronically and request is appropiate, it is usually completed in 1-2 business days.  - all patients will need to be seen routinely for all chronic medical conditions requiring prescription medications (see  follow-up below). If you are overdue for follow up on your condition, you will be asked to make an appointment and we will call in enough medication to cover you until your appointment (up to 30 days).  - all controlled substances will require a face to face visit to request/refill.  - if you desire your prescriptions to go through a new pharmacy, and have an active script at original pharmacy, you will need to call your pharmacy and have scripts transferred to new pharmacy. This is completed between the pharmacy locations and not by your provider.    Results: If any images or labs were ordered, it can take up to 1 week to get results depending on the test ordered and the lab/facility running and resulting the test. - Normal or stable results, which do not need further discussion, may be released to your mychart immediately with attached note to you. A call may not be generated for normal results. Please make certain to sign up for mychart. If you have questions on how to activate your mychart you can call the front office.  - If your results need further discussion, our office will attempt to contact you via phone, and if unable to reach you after 2 attempts, we will release your abnormal result to your mychart with instructions.  - All results will be automatically released in mychart after 1 week.  - Your provider will provide you with explanation and instruction on all relevant material in your results. Please keep in mind, results and labs may appear confusing or abnormal to the untrained eye, but it does not mean they are actually abnormal for you personally. If you have any questions about your results that are not covered, or you desire more detailed explanation than what was provided, you should make an appointment with your provider to do so.   Our office handles many outgoing and incoming calls daily. If we have not contacted you within 1 week about your results, please check your mychart to  see if there is a message first and if not, then contact our office.  In helping with this matter, you help decrease call volume, and therefore allow [place] to be able to respond to patients needs more efficiently.   Acute office visits (sick visit):  An acute visit is intended for a new problem and are scheduled in shorter time slots to allow schedule openings for patients with new problems. This is the appropriate visit to discuss a new problem. In order to provide you with excellent quality medical care with proper time for you to explain your problem, have an exam and receive treatment with instructions, these appointments should be limited to one new problem per visit. If you experience a new problem, in which you desire to be addressed, please make an acute office visit, we save openings on the schedule to accommodate you. Please do not save your new problem for any other type of visit, let us take care of it properly and  quickly for you.   Follow up visits:  Depending on your condition(s) your provider will need to see you routinely in order to provide you with quality care and prescribe medication(s). Most chronic conditions (Example: hypertension, Diabetes, depression/anxiety... etc), require visits a couple times a year. Your provider will instruct you on proper follow up for your personal medical conditions and history. Please make certain to make follow up appointments for your condition as instructed. Failing to do so could result in lapse in your medication treatment/refills. If you request a refill, and are overdue to be seen on a condition, we will always provide you with a 30 day script (once) to allow you time to schedule.    Medicare wellness (well visit): - we have a wonderful Nurse Maudie Mercury), that will meet with you and provide you will yearly medicare wellness visits. These visits should occur yearly (can not be scheduled less than 1 calendar year apart) and cover preventive health,  immunizations, advance directives and screenings you are entitled to yearly through your medicare benefits. Do not miss out on your entitled benefits, this is when medicare will pay for these benefits to be ordered for you.  These are strongly encouraged by your provider and is the appropriate type of visit to make certain you are up to date with all preventive health benefits. If you have not had your medicare wellness exam in the last 12 months, please make certain to schedule one by calling the office and schedule your medicare wellness with Maudie Mercury as soon as possible.   Yearly physical (well visit):  - Adults are recommended to be seen yearly for physicals. Check with your insurance and date of your last physical, most insurances require one calendar year between physicals. Physicals include all preventive health topics, screenings, medical exam and labs that are appropriate for gender/age and history. You may have fasting labs needed at this visit. This is a well visit (not a sick visit), new problems should not be covered during this visit (see acute visit).  - Pediatric patients are seen more frequently when they are younger. Your provider will advise you on well child visit timing that is appropriate for your their age. - This is not a medicare wellness visit. Medicare wellness exams do not have an exam portion to the visit. Some medicare companies allow for a physical, some do not allow a yearly physical. If your medicare allows a yearly physical you can schedule the medicare wellness with our nurse Maudie Mercury and have your physical with your provider after, on the same day. Please check with insurance for your full benefits.   Late Policy/No Shows:  - all new patients should arrive 15-30 minutes earlier than appointment to allow [place] time  to  obtain all personal demographics,  insurance information and for you to complete office paperwork. - All established patients should arrive 10-15 minutes earlier than  appointment time to update all information and be checked in .  - In our best efforts to run on time, if you are late for your appointment you will be asked to either reschedule or if able, we will work you back into the schedule. There will be a wait time to work you back in the schedule,  depending on availability.  - If you are unable to make it to your appointment as scheduled, please call 24 hours ahead of time to allow [place] to fill the time slot with someone else who needs to be seen. If you do  not cancel your appointment ahead of time, you may be charged a no show fee.

## 2016-08-25 NOTE — Addendum Note (Signed)
Addended by: Leota Jacobsen on: 08/25/2016 04:15 PM   Modules accepted: Orders

## 2016-08-25 NOTE — Progress Notes (Signed)
Carla Beltran , Nov 28, 1965, 51 y.o., female MRN: [id number] Patient Care Team    Relationship Specialty Notifications Start End  [name], DO PCP - General Family Medicine  05/28/16     Chief Complaint  Patient presents with  . bump on foot    left,has tngling sensation     Subjective: Pt presents for an OV with complaints of mass of left foot of a few months duration.  Associated symptoms include tingling sensation down toes when she touches it. She reports she may have dropped a lid to a pot on her foot a around Thanksgiving time, but she is uncertain. The last month she has noticed a mass on her left foot that sends a tingling sensation down her foot when she touches. She feels it might be getting a little larger over the last few months.    Depression screen [facility] 2/9 06/25/2016 05/28/2016  Decreased Interest 0 1  Down, Depressed, Hopeless 0 1  PHQ - 2 Score 0 2  Altered sleeping - 0  Tired, decreased energy - 0  Change in appetite - 0  Feeling bad or failure about yourself  - 1  Trouble concentrating - 0  Moving slowly or fidgety/restless - 0  Suicidal thoughts - 0  PHQ-9 Score - 3    No Known Allergies Social History  Substance Use Topics  . Smoking status: Never Smoker  . Smokeless tobacco: Never Used  . Alcohol use No   Past Medical History:  Diagnosis Date  . Anxiety   . Fatigue   . Frequent UTI   . Hyperlipidemia   . Migraine   . Reactive airway disease   . Renal stones   . Rosacea   . Vertigo    was on antivert   Past Surgical History:  Procedure Laterality Date  . BREAST BIOPSY  1990   benign  . LASIK  2001  . TONSILLECTOMY AND ADENOIDECTOMY  1985   Family History  Problem Relation Age of Onset  . Diabetes Mother   . Mental illness Mother        Hoarder  . Dementia Mother   . Asthma Mother   . Depression Mother   . Migraines Mother   . Atrial fibrillation Father   . Hypertension Father   . Diabetes Maternal Aunt   . Hearing loss  Maternal Aunt   . Stroke Maternal Grandmother   . Arthritis Maternal Grandfather   . Heart disease Maternal Grandfather    Allergies as of 08/25/2016   No Known Allergies     Medication List       Accurate as of 08/25/16  4:05 PM. Always use your most recent med list.          nitrofurantoin (macrocrystal-monohydrate) 100 MG capsule Commonly known as:  MACROBID Take 1 capsule (100 mg total) by mouth as needed. 1 capsule after intercourse to prevent UTI   PARoxetine 20 MG tablet Commonly known as:  PAXIL Take 1 tablet (20 mg total) by mouth daily.       All past medical history, surgical history, allergies, family history, immunizations andmedications were updated in the EMR today and reviewed under the history and medication portions of their EMR.     ROS: Negative, with the exception of above mentioned in HPI   Objective:  BP 127/80 (BP Location: Right Arm, Patient Position: Sitting, Cuff Size: Normal)   Pulse 82   Temp 98.1 F (36.7 C)   Resp 20  Wt 135 lb 8 oz (61.5 kg)   SpO2 98%   BMI 22.55 kg/m  Body mass index is 22.55 kg/m. Gen: Afebrile. No acute distress. Nontoxic in appearance, well developed, well nourished.  MSK: no erythema. Small soft tissue mass ~1.5 cm over 4th  Metatarsal. Radiation of tingling sensation to toes with palpation. NV intact distally.  Skin: no rashes, bruises, purpura or petechiae.  Neuro: Normal gait. PERLA. EOMi. Alert. Oriented x3 Psych: extremely anxious.  No exam data present No results found. No results found for this or any previous visit (from the past 24 hour(s)).  Assessment/Plan: Carla Beltran is a 51 y.o. female present for OV for  Foot mass, left - exam feels cyst-like or neuroma. Radiation/shock with touch. She also has a bone cyst right great toe. Discussed referral to podiatry. She will look for a podiatrist she would like to be referred, have her xray of the left completed, and we will refer once results obtained.    - DG Foot Complete Left; Future - F/U PRN  preappt labs ordered for CPE. (A1c and lipids- all others recently collected with est)   Reviewed expectations re: course of current medical issues.  Discussed self-management of symptoms.  Outlined signs and symptoms indicating need for more acute intervention.  Patient verbalized understanding and all questions were answered.  Patient received an After-Visit Summary.    Orders Placed This Encounter  Procedures  . DG Foot Complete Left  . Hemoglobin A1c  . Lipid panel     Note is dictated utilizing voice recognition software. Although note has been proof read prior to signing, occasional typographical errors still can be missed. If any questions arise, please do not hesitate to call for verification.   electronically signed by:  Howard Pouch, DO  [place]

## 2016-08-26 ENCOUNTER — Telehealth: Payer: Self-pay | Admitting: Family Medicine

## 2016-08-26 NOTE — Telephone Encounter (Signed)
Spoke with patient reviewed xray results . Patient states she will have to call us back regarding podiatry referral she is traveling right now and was unable to look it up.

## 2016-08-26 NOTE — Telephone Encounter (Signed)
Please call pt: - her xray did not show nay bony abnormality at the site of concern.  - We discussed referral to podiatry and she was going to look into who she would like referred. I will place this referral today. Please ask her the name of the podiatrist she has chosen.

## 2016-08-29 NOTE — Telephone Encounter (Signed)
FYI:  Patient just wanted to let Dr Raoul Pitch know that she is still out of town and hasn't been able to pick a podiatrist.  She will do it as soon as she returns.

## 2016-09-12 ENCOUNTER — Ambulatory Visit (INDEPENDENT_AMBULATORY_CARE_PROVIDER_SITE_OTHER): Payer: 59 | Admitting: Family Medicine

## 2016-09-12 ENCOUNTER — Encounter: Payer: Self-pay | Admitting: Family Medicine

## 2016-09-12 VITALS — BP 132/75 | HR 73 | Temp 98.1°F | Resp 20 | Ht 65.0 in | Wt 135.5 lb

## 2016-09-12 DIAGNOSIS — F410 Panic disorder [episodic paroxysmal anxiety] without agoraphobia: Secondary | ICD-10-CM

## 2016-09-12 DIAGNOSIS — F411 Generalized anxiety disorder: Secondary | ICD-10-CM | POA: Diagnosis not present

## 2016-09-12 MED ORDER — PAROXETINE HCL 20 MG PO TABS: 20.0000 mg | ORAL_TABLET | Freq: Every day | ORAL | 1 refills | Status: DC

## 2016-09-12 NOTE — Patient Instructions (Addendum)
I am glad you are doing well.  I would suggest you keep the paxil the same dose. I have called in refills (90 days) with 1 additional refill.   Follow up every 6 months as long as doing ok and seeing your therapist.

## 2016-09-12 NOTE — Progress Notes (Signed)
Patient ID: Carla Beltran, female  DOB: 1965/03/25, 51 y.o.   MRN: [id number] Patient Care Team    Relationship Specialty Notifications Start End  [name], DO PCP - General Family Medicine  05/28/16    Chief Complaint  Patient presents with  . Anxiety    Subjective:  Carla Beltran is a 51 y.o.  female present for follow up on  Anxiety/phobia:  Patient reports she is doing well on Paxil 20 mg daily, she states that her husband also believes she is doing much better. She feels she is able to cope with anxiety and pressures better on this medication. She does endorse that she still anxious and talkative but that is her normal personality. She denies any negative side effects to this medication. She would like refills on the medicine today.   Prior note:  Patient presents for establishment today and admits to increased anxiety and panic attacks. She has fears that she has trouble controlling. She worries about everything. She has a strong fear of driving on the [place], especially in areas she is unfamiliar with. She endorses panic attacks that occur during driving and at other times. She states she experiences a increased heart rate and then feeling like she is "out of her body ". She reports increased anxiety in her life especially around times when she is moving, such as currently. She states she's always had anxiety. She reports she did not have a great childhood. Her mother now has dementia and is in [place] in a nursing facility, this also caused her increased anxiety. Her mother with hoarder. She has 2 daughters, which also are affected by anxiety and are on Celexa. She states her daughters are not affected by his much panic she is. She reports being on Zoloft since 2006. She was initially on 25 mg of Zoloft daily, and eventually increased up to Zoloft 50 mg daily. Prior to moving in August 2017 her prior provider told her to increase to 75 mg daily. She states that she did  not like the apathetic side effects she experienced at 75 mg dose, so she decreased back down to the 50 mg dose daily. She reports being seen by counselor many times in the past. She has seek the help of a Marketing executive in [place] Leanor Rubenstein). She has yet to establish with her.  Mood disorder screening: negative.   Depression screen [facility] 2/9 06/25/2016 05/28/2016  Decreased Interest 0 1  Down, Depressed, Hopeless 0 1  PHQ - 2 Score 0 2  Altered sleeping - 0  Tired, decreased energy - 0  Change in appetite - 0  Feeling bad or failure about yourself  - 1  Trouble concentrating - 0  Moving slowly or fidgety/restless - 0  Suicidal thoughts - 0  PHQ-9 Score - 3   GAD 7 : Generalized Anxiety Score 05/28/2016  Nervous, Anxious, on Edge 1  Control/stop worrying 1  Worry too much - different things 1  Trouble relaxing 1  Restless 0  Easily annoyed or irritable 0  Afraid - awful might happen 0  Total GAD 7 Score 4  Anxiety Difficulty Very difficult         Immunization History  Administered Date(s) Administered  . Tdap 10/26/2012     Past Medical History:  Diagnosis Date  . Anxiety   . Fatigue   . Frequent UTI   . Hyperlipidemia   . Migraine   . Reactive airway disease   .  Renal stones   . Rosacea   . Vertigo    was on antivert   No Known Allergies Past Surgical History:  Procedure Laterality Date  . BREAST BIOPSY  1990   benign  . LASIK  2001  . TONSILLECTOMY AND ADENOIDECTOMY  1985   Family History  Problem Relation Age of Onset  . Diabetes Mother   . Mental illness Mother        Hoarder  . Dementia Mother   . Asthma Mother   . Depression Mother   . Migraines Mother   . Atrial fibrillation Father   . Hypertension Father   . Diabetes Maternal Aunt   . Hearing loss Maternal Aunt   . Stroke Maternal Grandmother   . Arthritis Maternal Grandfather   . Heart disease Maternal Grandfather    Social History   Social History  . Marital status:  Married    Spouse name: Carla Beltran  . Number of children: 2  . Years of education: 14   Occupational History  . [employer]    Social History Main Topics  . Smoking status: Never Smoker  . Smokeless tobacco: Never Used  . Alcohol use No  . Drug use: No  . Sexual activity: Yes    Partners: Male    Birth control/ protection: Surgical     Comment: Married; husband vasectomy   Other Topics Concern  . Not on file   Social History Narrative   Married to Carla Beltran. Two adult children Carla Beltran and Carla Beltran (in college).    Moved from [place] [facility]  area) 09/2015.    SAHM. College educated.    Drinks one cup of coffee a day.   Takes a daily vitamin.   Wears seatbelts, bicycle helmet and smoke detector in the home.   Tries to exercise routinely.   Feels safe in her relationships.   Allergies as of 09/12/2016   No Known Allergies     Medication List       Accurate as of 09/12/16  6:20 PM. Always use your most recent med list.          nitrofurantoin (macrocrystal-monohydrate) 100 MG capsule Commonly known as:  MACROBID Take 1 capsule (100 mg total) by mouth as needed. 1 capsule after intercourse to prevent UTI   PARoxetine 20 MG tablet Commonly known as:  PAXIL Take 1 tablet (20 mg total) by mouth daily.        Recent Results (from the past 2160 hour(s))  CBC w/Diff     Status: None   Collection Time: 07/28/16  1:42 PM  Result Value Ref Range   WBC 5.8 4.0 - 10.5 K/uL   RBC 4.29 3.87 - 5.11 Mil/uL   Hemoglobin 13.3 12.0 - 15.0 g/dL   HCT 38.9 36.0 - 46.0 %   MCV 90.8 78.0 - 100.0 fl   MCHC 34.0 30.0 - 36.0 g/dL   RDW 12.9 11.5 - 15.5 %   Platelets 305.0 150.0 - 400.0 K/uL   Neutrophils Relative % 56.1 43.0 - 77.0 %   Lymphocytes Relative 32.7 12.0 - 46.0 %   Monocytes Relative 8.9 3.0 - 12.0 %   Eosinophils Relative 1.3 0.0 - 5.0 %   Basophils Relative 1.0 0.0 - 3.0 %   Neutro Abs 3.2 1.4 - 7.7 K/uL   Lymphs Abs 1.9 0.7 - 4.0 K/uL   Monocytes Absolute 0.5 0.1 - 1.0 K/uL     Eosinophils Absolute 0.1 0.0 - 0.7 K/uL   Basophils Absolute 0.1  0.0 - 0.1 K/uL  Comp Met (CMET)     Status: None   Collection Time: 07/28/16  1:42 PM  Result Value Ref Range   Sodium 139 135 - 145 mEq/L   Potassium 4.4 3.5 - 5.1 mEq/L   Chloride 102 96 - 112 mEq/L   CO2 29 19 - 32 mEq/L   Glucose, Bld 82 70 - 99 mg/dL   BUN 13 6 - 23 mg/dL   Creatinine, Ser 0.69 0.40 - 1.20 mg/dL   Total Bilirubin 0.5 0.2 - 1.2 mg/dL   Alkaline Phosphatase 64 39 - 117 U/L   AST 19 0 - 37 U/L   ALT 15 0 - 35 U/L   Total Protein 6.4 6.0 - 8.3 g/dL   Albumin 4.5 3.5 - 5.2 g/dL   Calcium 10.0 8.4 - 10.5 mg/dL   GFR 95.18 >60.00 mL/min  INR/PT     Status: None   Collection Time: 07/28/16  1:42 PM  Result Value Ref Range   INR 1.0 0.8 - 1.0 ratio   Prothrombin Time 11.2 9.6 - 13.1 sec  PTT     Status: Abnormal   Collection Time: 07/28/16  1:42 PM  Result Value Ref Range   aPTT 32.8 (H) 23.4 - 32.7 SEC  TSH     Status: None   Collection Time: 07/28/16  1:42 PM  Result Value Ref Range   TSH 1.68 0.35 - 4.50 uIU/mL    Patient was never admitted.  ROS: 14 pt review of systems performed and negative (unless mentioned in an HPI)  Objective: BP 132/75 (BP Location: Left Arm, Patient Position: Sitting, Cuff Size: Normal)   Pulse 73   Temp 98.1 F (36.7 C)   Resp 20   Ht 5' 5"  (1.651 m)   Wt 135 lb 8 oz (61.5 kg)   SpO2 97%   BMI 22.55 kg/m   Gen: Afebrile. No acute distress.  HENT: AT. [place]. MMM.  Eyes:Pupils Equal Round Reactive to light, Extraocular movements intact,  Conjunctiva without redness, discharge or icterus. Psych: Talkative, mildly pressured speech. Moderately anxious. Otherwise, Normal affect, dress and demeanor. Normal judgment and insight.  Assessment/plan: Charron Coultas is a 51 y.o. female present for establish care Generalized anxiety disorder/panic attacks - Stable today, doing well on Paxil 20 mg daily briefly discussed increasing dose, which she likely would benefit.  Although her heightened anxiety surrounding medication use at all, and being oversedated may work against her and that aspect. Overall she is doing okay, so will leave medication dose the same. - PARoxetine (PAXIL) 20 MG tablet; Take 1 tablet (20 mg total) by mouth daily.  Dispense: 90 tablet; Refill: 1 - continue Marketing executive. - Patient was given breathing exercises to attempt during high anxiety times and when driving if necessary. - F/U every 6 months, unless needed sooner  Return in about 6 months (around 03/15/2017) for anxiety.  > 25 minutes spent with patient, >50% of time spent face to face counseling   Electronically signed by: Howard Pouch, [place]

## 2016-09-23 ENCOUNTER — Ambulatory Visit: Payer: 59 | Admitting: Family Medicine

## 2016-09-26 ENCOUNTER — Encounter: Payer: PRIVATE HEALTH INSURANCE | Admitting: Family Medicine

## 2016-10-06 ENCOUNTER — Other Ambulatory Visit (INDEPENDENT_AMBULATORY_CARE_PROVIDER_SITE_OTHER): Payer: 59

## 2016-10-06 DIAGNOSIS — Z131 Encounter for screening for diabetes mellitus: Secondary | ICD-10-CM

## 2016-10-06 DIAGNOSIS — E785 Hyperlipidemia, unspecified: Secondary | ICD-10-CM

## 2016-10-06 LAB — LIPID PANEL
CHOL/HDL RATIO: 3
Cholesterol: 234 mg/dL — ABNORMAL HIGH (ref 0–200)
HDL: 72.4 mg/dL (ref >39.00)
LDL CALC: 142 mg/dL — AB (ref 0–99)
NONHDL: 161.84
TRIGLYCERIDES: 99 mg/dL (ref 0.0–149.0)
VLDL: 19.8 mg/dL (ref 0.0–40.0)

## 2016-10-06 LAB — HEMOGLOBIN A1C: Hgb A1c MFr Bld: 5.3 % (ref 4.6–6.5)

## 2016-10-08 ENCOUNTER — Encounter: Payer: Self-pay | Admitting: Family Medicine

## 2016-10-08 ENCOUNTER — Ambulatory Visit (INDEPENDENT_AMBULATORY_CARE_PROVIDER_SITE_OTHER): Payer: 59 | Admitting: Family Medicine

## 2016-10-08 VITALS — BP 113/74 | HR 70 | Temp 98.7°F | Resp 20 | Ht 65.0 in | Wt 138.0 lb

## 2016-10-08 DIAGNOSIS — Z0000 Encounter for general adult medical examination without abnormal findings: Secondary | ICD-10-CM | POA: Diagnosis not present

## 2016-10-08 DIAGNOSIS — E785 Hyperlipidemia, unspecified: Secondary | ICD-10-CM

## 2016-10-08 DIAGNOSIS — Z1239 Encounter for other screening for malignant neoplasm of breast: Secondary | ICD-10-CM

## 2016-10-08 DIAGNOSIS — Z1231 Encounter for screening mammogram for malignant neoplasm of breast: Secondary | ICD-10-CM

## 2016-10-08 MED ORDER — ZOSTER VAC RECOMB ADJUVANTED 50 MCG/0.5ML IM SUSR: 0.5000 mL | Freq: Once | INTRAMUSCULAR | 1 refills | Status: AC

## 2016-10-08 NOTE — Patient Instructions (Signed)

## 2016-10-08 NOTE — Progress Notes (Signed)
Patient ID: Carla Beltran, female  DOB: 1965/12/01, 51 y.o.   MRN: [id number] Patient Care Team    Relationship Specialty Notifications Start End  [name], DO PCP - General Family Medicine  05/28/16     Chief Complaint  Patient presents with  . Annual Exam    Subjective:  Carla Beltran is a 51 y.o.  Female  present for CPE. All past medical history, surgical history, allergies, family history, immunizations, medications and social history were updated in the electronic medical record today. All recent labs, ED visits and hospitalizations within the last year were reviewed.  Health maintenance:  Colonoscopy: Never, due. Order will placed after she finds the person she would like referred to, she will call back to give [place] the name.  Mammogram: completed:2016, normal. Desires 3d. Cervical cancer screening: last pap: 02/2014 per pt, "always normal" Immunizations: tdap 2014 UTD, Influenza  (encouraged yearly). Shingrix offered, and pt agreeable to script.  Infectious disease screening: HIV completed 1998 DEXA: N/A Assistive device: N/a Oxygen SFK:CLEX Patient has a Dental home. Hospitalizations/ED visits: reviewed   Depression screen [facility] 2/9 10/08/2016 10/08/2016 06/25/2016 05/28/2016  Decreased Interest 0 0 0 1  Down, Depressed, Hopeless 0 0 0 1  PHQ - 2 Score 0 0 0 2  Altered sleeping 0 - - 0  Tired, decreased energy 0 - - 0  Change in appetite 0 - - 0  Feeling bad or failure about yourself  0 - - 1  Trouble concentrating 0 - - 0  Moving slowly or fidgety/restless 0 - - 0  Suicidal thoughts 0 - - 0  PHQ-9 Score 0 - - 3   GAD 7 : Generalized Anxiety Score 05/28/2016  Nervous, Anxious, on Edge 1  Control/stop worrying 1  Worry too much - different things 1  Trouble relaxing 1  Restless 0  Easily annoyed or irritable 0  Afraid - awful might happen 0  Total GAD 7 Score 4  Anxiety Difficulty Very difficult    Current Exercise Habits: Home exercise routine, Type of  exercise: walking, Time (Minutes): 30, Frequency (Times/Week): 5, Weekly Exercise (Minutes/Week): 150, Intensity: Mild    Immunization History  Administered Date(s) Administered  . Tdap 10/26/2012    Past Medical History:  Diagnosis Date  . Anxiety   . Fatigue   . Frequent UTI   . Hyperlipidemia   . Migraine   . Reactive airway disease   . Renal stones   . Rosacea   . Vertigo    was on antivert   No Known Allergies Past Surgical History:  Procedure Laterality Date  . BREAST BIOPSY  1990   benign  . LASIK  2001  . TONSILLECTOMY AND ADENOIDECTOMY  1985   Family History  Problem Relation Age of Onset  . Diabetes Mother   . Mental illness Mother        Hoarder  . Dementia Mother   . Asthma Mother   . Depression Mother   . Migraines Mother   . Atrial fibrillation Father   . Hypertension Father   . Diabetes Maternal Aunt   . Hearing loss Maternal Aunt   . Stroke Maternal Grandmother   . Arthritis Maternal Grandfather   . Heart disease Maternal Grandfather    Social History   Social History  . Marital status: Married    Spouse name: Carla Beltran  . Number of children: 2  . Years of education: 14   Occupational History  . SAHM  Social History Main Topics  . Smoking status: Never Smoker  . Smokeless tobacco: Never Used  . Alcohol use No  . Drug use: No  . Sexual activity: Yes    Partners: Male    Birth control/ protection: Surgical     Comment: Married; husband vasectomy   Other Topics Concern  . Not on file   Social History Narrative   Married to [place]. Two adult children Carla Beltran and Carla Beltran (in college).    Moved from [place] [facility]  area) 09/2015.    SAHM. College educated.    Drinks one cup of coffee a day.   Takes a daily vitamin.   Wears seatbelts, bicycle helmet and smoke detector in the home.   Tries to exercise routinely.   Feels safe in her relationships.   Allergies as of 10/08/2016   No Known Allergies     Medication List         Accurate as of 10/08/16 11:27 AM. Always use your most recent med list.          nitrofurantoin (macrocrystal-monohydrate) 100 MG capsule Commonly known as:  MACROBID Take 1 capsule (100 mg total) by mouth as needed. 1 capsule after intercourse to prevent UTI   PARoxetine 20 MG tablet Commonly known as:  PAXIL Take 1 tablet (20 mg total) by mouth daily.   Zoster Vac Recomb Adjuvanted injection Commonly known as:  SHINGRIX Inject 0.5 mLs into the muscle once.            Discharge Care Instructions        Start     Ordered   10/08/16 0000  MM SCREENING BREAST TOMO BILATERAL    Question Answer Comment  Reason for Exam (SYMPTOM  OR DIAGNOSIS REQUIRED) Pt reports she needs 3d bc of dense breast   Is the patient pregnant? No   Preferred imaging location? [facility]      10/08/16 1117   10/08/16 0000  Zoster Vac Recomb Adjuvanted [facility]) injection   Once     10/08/16 1122      All past medical history, surgical history, allergies, family history, immunizations andmedications were updated in the EMR today and reviewed under the history and medication portions of their EMR.     Recent Results (from the past 2160 hour(s))  CBC w/Diff     Status: None   Collection Time: 07/28/16  1:42 PM  Result Value Ref Range   WBC 5.8 4.0 - 10.5 K/uL   RBC 4.29 3.87 - 5.11 Mil/uL   Hemoglobin 13.3 12.0 - 15.0 g/dL   HCT 38.9 36.0 - 46.0 %   MCV 90.8 78.0 - 100.0 fl   MCHC 34.0 30.0 - 36.0 g/dL   RDW 12.9 11.5 - 15.5 %   Platelets 305.0 150.0 - 400.0 K/uL   Neutrophils Relative % 56.1 43.0 - 77.0 %   Lymphocytes Relative 32.7 12.0 - 46.0 %   Monocytes Relative 8.9 3.0 - 12.0 %   Eosinophils Relative 1.3 0.0 - 5.0 %   Basophils Relative 1.0 0.0 - 3.0 %   Neutro Abs 3.2 1.4 - 7.7 K/uL   Lymphs Abs 1.9 0.7 - 4.0 K/uL   Monocytes Absolute 0.5 0.1 - 1.0 K/uL   Eosinophils Absolute 0.1 0.0 - 0.7 K/uL   Basophils Absolute 0.1 0.0 - 0.1 K/uL  Comp Met (CMET)     Status: None    Collection Time: 07/28/16  1:42 PM  Result Value Ref Range   Sodium 139  135 - 145 mEq/L   Potassium 4.4 3.5 - 5.1 mEq/L   Chloride 102 96 - 112 mEq/L   CO2 29 19 - 32 mEq/L   Glucose, Bld 82 70 - 99 mg/dL   BUN 13 6 - 23 mg/dL   Creatinine, Ser 0.69 0.40 - 1.20 mg/dL   Total Bilirubin 0.5 0.2 - 1.2 mg/dL   Alkaline Phosphatase 64 39 - 117 U/L   AST 19 0 - 37 U/L   ALT 15 0 - 35 U/L   Total Protein 6.4 6.0 - 8.3 g/dL   Albumin 4.5 3.5 - 5.2 g/dL   Calcium 10.0 8.4 - 10.5 mg/dL   GFR 95.18 >60.00 mL/min  INR/PT     Status: None   Collection Time: 07/28/16  1:42 PM  Result Value Ref Range   INR 1.0 0.8 - 1.0 ratio   Prothrombin Time 11.2 9.6 - 13.1 sec  PTT     Status: Abnormal   Collection Time: 07/28/16  1:42 PM  Result Value Ref Range   aPTT 32.8 (H) 23.4 - 32.7 SEC  TSH     Status: None   Collection Time: 07/28/16  1:42 PM  Result Value Ref Range   TSH 1.68 0.35 - 4.50 uIU/mL  Lipid panel     Status: Abnormal   Collection Time: 10/06/16  7:56 AM  Result Value Ref Range   Cholesterol 234 (H) 0 - 200 mg/dL    Comment: ATP III Classification       Desirable:  < 200 mg/dL               Borderline High:  200 - 239 mg/dL          High:  > = 240 mg/dL   Triglycerides 99.0 0.0 - 149.0 mg/dL    Comment: Normal:  <150 mg/dLBorderline High:  150 - 199 mg/dL   HDL 72.40 >39.00 mg/dL   VLDL 19.8 0.0 - 40.0 mg/dL   LDL Cholesterol 142 (H) 0 - 99 mg/dL   Total CHOL/HDL Ratio 3     Comment:                Men          Women1/2 Average Risk     3.4          3.3Average Risk          5.0          4.42X Average Risk          9.6          7.13X Average Risk          15.0          11.0                       NonHDL 161.84     Comment: NOTE:  Non-HDL goal should be 30 mg/dL higher than patient's LDL goal (i.e. LDL goal of < 70 mg/dL, would have non-HDL goal of < 100 mg/dL)  HgB A1c     Status: None   Collection Time: 10/06/16  7:56 AM  Result Value Ref Range   Hgb A1c MFr Bld 5.3 4.6 - 6.5 %      Comment: Glycemic Control Guidelines for People with Diabetes:Non Diabetic:  <6%Goal of Therapy: <7%Additional Action Suggested:  >8%     ROS: 14 pt review of systems performed and negative (unless mentioned in an HPI)  Objective: BP  113/74 (BP Location: Right Arm, Patient Position: Sitting, Cuff Size: Normal)   Pulse 70   Temp 98.7 F (37.1 C)   Resp 20   Ht _0  (1.651 m)   Wt 138 lb (62.6 kg)   SpO2 97%   BMI 22.96 kg/m  Gen: Afebrile. No acute distress. Nontoxic in appearance, well-developed, well-nourished,  pleasant caucasian female.  HENT: AT. [place]. Bilateral TM visualized and normal in appearance, normal external auditory canal. MMM, no oral lesions, adequate dentition. Bilateral nares within normal limits. Throat without erythema, ulcerations or exudates. no Cough on exam, no hoarseness on exam. Eyes:Pupils Equal Round Reactive to light, Extraocular movements intact,  Conjunctiva without redness, discharge or icterus. Neck/lymp/endocrine: Supple,no lymphadenopathy, no thyromegaly CV: RRR no murmur, non edema, +2/4 P posterior tibialis pulses. no carotid bruits. No JVD. Chest: CTAB, no wheeze, rhonchi or crackles. normal Respiratory effort. good Air movement. Abd: Soft. flat. NTND. BS present. no Masses palpated. No hepatosplenomegaly. No rebound tenderness or guarding. Skin: no rashes, purpura or petechiae. Warm and well-perfused. Skin intact. Neuro/Msk:  Normal gait. PERLA. EOMi. Alert. Oriented x3.  Cranial nerves II through XII intact. Muscle strength 5/5 upper/lower extremity. DTRs equal bilaterally. Psych: mildly Anxious, otherwise her Normal affect, dress and demeanor. Normal speech. Normal thought content and judgment.   No exam data present  Assessment/plan: Mairany Bruno is a 51 y.o. female present for CPE. Breast cancer screening - MM SCREENING BREAST TOMO BILATERAL; Future Encounter for preventive health examination Patient was encouraged to exercise greater  than 150 minutes a week. Patient was encouraged to choose a diet filled with fresh fruits and vegetables, and lean meats. AVS provided to patient today for education/recommendation on gender specific health and safety maintenance. Colonoscopy: Never, due. Order will placed after she finds the person she would like referred to, she will call back to give [place] the name.  Mammogram: completed:2016, normal. Desires 3d. Cervical cancer screening: last pap: 02/2014 per pt, "always normal" Immunizations: tdap 2014 UTD, Influenza  (encouraged yearly). Shingrix offered, and pt agreeable to script.  Infectious disease screening: HIV completed 1998 DEXA: N/A Hyperlipidemia, unspecified hyperlipidemia type - advised fish oil, krill oil, red yeast rice etc.  - diet and exercise.   Return in about 1 year (around 10/08/2017) for CPE.  Electronically signed by: Howard Pouch, DO [place]

## 2016-11-12 ENCOUNTER — Telehealth: Payer: Self-pay | Admitting: Family Medicine

## 2016-11-12 NOTE — Telephone Encounter (Signed)
[place] [facility] - Client [place] Patient Name: Carla Beltran DOB: 03/15/1965 Initial Comment Caller states she found a tick on her thigh and she scratched it thinking it was a scab then noticed it was still there and looked at it under an eye glass and saw it. She has a red spot so she believes it bit her. Nurse Assessment Nurse: Martyn Ehrich RN, [place] Date/Time (Eastern Time): 11/12/2016 4:56:45 PM Confirm and document reason for call. If symptomatic, describe symptoms. ---PT was bit by tick on L thigh that she noted this afternoon. She removed it and it is red there. It is very small. No fever Does the patient have any new or worsening symptoms? ---Yes Will a triage be completed? ---Yes Related visit to physician within the last 2 weeks? ---No Does the PT have any chronic conditions? (i.e. diabetes, asthma, etc.) ---No Is the patient pregnant or possibly pregnant? (Ask all females between the ages of 52-55) ---No Is this a behavioral health or substance abuse call? ---NoGuidelines Guideline Title Affirmed Question Affirmed Notes Tick Bite [1] Probable deer tick AND [2] attached > 36 hours (or tick appears swollen, not flat) AND [3] occurred in an area where Lyme disease is common Final Disposition User Call PCP within 24 Hours Gaddy, RN, [place]mon Ice Comments it looks like tiny pink dot now - there is what looks like a puncture in the middle - size of green pea - She mowed the yard Sat am - just noted it now - not outside today pea sized solid pink dot at the site - not red - but is pink Tic is tiny Referrals REFERRED TO PCP OFFICE Caller Disagree/Comply Comply Caller Understands Yes PreDisposition Did not know what to do Call Id: [id number]

## 2016-11-13 NOTE — Telephone Encounter (Signed)
Left message for patiemt to call back and schedule appt if needed to evaluate tick bite.

## 2016-11-19 ENCOUNTER — Ambulatory Visit
Admission: RE | Admit: 2016-11-19 | Discharge: 2016-11-19 | Disposition: A | Discharge status: Home / Self Care | Payer: 59 | Source: Ambulatory Visit | Attending: Family Medicine | Admitting: Family Medicine

## 2016-11-19 ENCOUNTER — Encounter (INDEPENDENT_AMBULATORY_CARE_PROVIDER_SITE_OTHER): Payer: Self-pay

## 2016-11-19 DIAGNOSIS — Z1231 Encounter for screening mammogram for malignant neoplasm of breast: Secondary | ICD-10-CM | POA: Diagnosis not present

## 2016-11-19 DIAGNOSIS — Z1239 Encounter for other screening for malignant neoplasm of breast: Secondary | ICD-10-CM

## 2016-11-20 ENCOUNTER — Telehealth: Payer: Self-pay

## 2016-11-20 DIAGNOSIS — Z1211 Encounter for screening for malignant neoplasm of colon: Secondary | ICD-10-CM

## 2016-11-20 NOTE — Telephone Encounter (Signed)
Patient stated for her colonoscopy she is requesting Dr. Loletha Carrow.

## 2016-11-21 NOTE — Telephone Encounter (Signed)
Requested GI referral placed for screening colonoscopy.

## 2016-12-31 ENCOUNTER — Encounter: Payer: Self-pay | Admitting: Gastroenterology

## 2017-01-12 ENCOUNTER — Other Ambulatory Visit: Payer: Self-pay | Admitting: Family Medicine

## 2017-01-12 DIAGNOSIS — F411 Generalized anxiety disorder: Secondary | ICD-10-CM

## 2017-01-12 DIAGNOSIS — F410 Panic disorder [episodic paroxysmal anxiety] without agoraphobia: Secondary | ICD-10-CM

## 2017-01-19 DIAGNOSIS — M9903 Segmental and somatic dysfunction of lumbar region: Secondary | ICD-10-CM | POA: Diagnosis not present

## 2017-01-21 DIAGNOSIS — M9903 Segmental and somatic dysfunction of lumbar region: Secondary | ICD-10-CM | POA: Diagnosis not present

## 2017-01-29 ENCOUNTER — Ambulatory Visit (AMBULATORY_SURGERY_CENTER): Payer: Self-pay | Admitting: *Deleted

## 2017-01-29 ENCOUNTER — Other Ambulatory Visit: Payer: Self-pay

## 2017-01-29 VITALS — Ht 65.0 in | Wt 152.6 lb

## 2017-01-29 DIAGNOSIS — Z1211 Encounter for screening for malignant neoplasm of colon: Secondary | ICD-10-CM

## 2017-01-29 MED ORDER — PEG-KCL-NACL-NASULF-NA ASC-C 140 G PO SOLR: 1.0000 | ORAL | 0 refills | Status: DC

## 2017-01-29 NOTE — Progress Notes (Signed)
No egg or soy allergy known to patient  No issues with past sedation with any surgeries  or procedures, no intubation problems  No diet pills per patient No home 02 use per patient  No blood thinners per patient  Pt denies issues with constipation  No A fib or A flutter  EMMI video sent to pt's e mail  

## 2017-02-03 DIAGNOSIS — M9903 Segmental and somatic dysfunction of lumbar region: Secondary | ICD-10-CM | POA: Diagnosis not present

## 2017-02-12 DIAGNOSIS — M9903 Segmental and somatic dysfunction of lumbar region: Secondary | ICD-10-CM | POA: Diagnosis not present

## 2017-02-25 ENCOUNTER — Encounter: Payer: Self-pay | Admitting: Gastroenterology

## 2017-02-25 DIAGNOSIS — M9903 Segmental and somatic dysfunction of lumbar region: Secondary | ICD-10-CM | POA: Diagnosis not present

## 2017-02-26 ENCOUNTER — Encounter: Payer: 59 | Admitting: Gastroenterology

## 2017-03-02 DIAGNOSIS — M9903 Segmental and somatic dysfunction of lumbar region: Secondary | ICD-10-CM | POA: Diagnosis not present

## 2017-03-04 ENCOUNTER — Other Ambulatory Visit: Payer: Self-pay

## 2017-03-04 ENCOUNTER — Encounter: Payer: Self-pay | Admitting: Gastroenterology

## 2017-03-04 ENCOUNTER — Ambulatory Visit (AMBULATORY_SURGERY_CENTER): Payer: 59 | Admitting: Gastroenterology

## 2017-03-04 VITALS — BP 102/58 | HR 61 | Temp 98.0°F | Resp 13 | Ht 65.0 in | Wt 152.0 lb

## 2017-03-04 DIAGNOSIS — D120 Benign neoplasm of cecum: Secondary | ICD-10-CM | POA: Diagnosis not present

## 2017-03-04 DIAGNOSIS — Z1211 Encounter for screening for malignant neoplasm of colon: Secondary | ICD-10-CM | POA: Diagnosis present

## 2017-03-04 DIAGNOSIS — Z1212 Encounter for screening for malignant neoplasm of rectum: Secondary | ICD-10-CM | POA: Diagnosis not present

## 2017-03-04 MED ORDER — SODIUM CHLORIDE 0.9 % IV SOLN: 500.0000 mL | Freq: Once | INTRAVENOUS | Status: DC

## 2017-03-04 NOTE — Progress Notes (Signed)
No problems noted in the recovery room. maw 

## 2017-03-04 NOTE — Patient Instructions (Signed)
YOU HAD AN ENDOSCOPIC PROCEDURE TODAY AT THE [facility]:   Refer to the procedure report that was given to you for any specific questions about what was found during the examination.  If the procedure report does not answer your questions, please call your gastroenterologist to clarify.  If you requested that your care partner not be given the details of your procedure findings, then the procedure report has been included in a sealed envelope for you to review at your convenience later.  YOU SHOULD EXPECT: Some feelings of bloating in the abdomen. Passage of more gas than usual.  Walking can help get rid of the air that was put into your GI tract during the procedure and reduce the bloating. If you had a lower endoscopy (such as a colonoscopy or flexible sigmoidoscopy) you may notice spotting of blood in your stool or on the toilet paper. If you underwent a bowel prep for your procedure, you may not have a normal bowel movement for a few days.  Please Note:  You might notice some irritation and congestion in your nose or some drainage.  This is from the oxygen used during your procedure.  There is no need for concern and it should clear up in a day or so.  SYMPTOMS TO REPORT IMMEDIATELY:   Following lower endoscopy (colonoscopy or flexible sigmoidoscopy):  Excessive amounts of blood in the stool  Significant tenderness or worsening of abdominal pains  Swelling of the abdomen that is new, acute  Fever of 100F or higher   For urgent or emergent issues, a gastroenterologist can be reached at any hour by calling [phone].   DIET:  We do recommend a small meal at first, but then you may proceed to your regular diet.  Drink plenty of fluids but you should avoid alcoholic beverages for 24 hours.  ACTIVITY:  You should plan to take it easy for the rest of today and you should NOT DRIVE or use heavy machinery until tomorrow (because of the sedation medicines used during the test).     FOLLOW UP: Our staff will call the number listed on your records the next business day following your procedure to check on you and address any questions or concerns that you may have regarding the information given to you following your procedure. If we do not reach you, we will leave a message.  However, if you are feeling well and you are not experiencing any problems, there is no need to return our call.  We will assume that you have returned to your regular daily activities without incident.  If any biopsies were taken you will be contacted by phone or by letter within the next 1-3 weeks.  Please call us at [phone] if you have not heard about the biopsies in 3 weeks.    SIGNATURES/CONFIDENTIALITY: You and/or your care partner have signed paperwork which will be entered into your electronic medical record.  These signatures attest to the fact that that the information above on your After Visit Summary has been reviewed and is understood.  Full responsibility of the confidentiality of this discharge information lies with you and/or your care-partner.   Handout was given to your care partner on polyps. You may resume your current medications today. Await biopsy results. Please call if any questions or concerns.   

## 2017-03-04 NOTE — Progress Notes (Signed)
To recovery, report to RN, VSS. 

## 2017-03-04 NOTE — Progress Notes (Signed)
Called to room to assist during endoscopic procedure.  Patient ID and intended procedure confirmed with present staff. Received instructions for my participation in the procedure from the performing physician.  

## 2017-03-04 NOTE — Op Note (Signed)
[facility] Patient Name: Carla Beltran Procedure Date: 03/04/2017 8:01 AM MRN: [id number] Endoscopist: Mallie Mussel L. Loletha Carrow , MD Age: 52 Referring MD:  Date of Birth: 1965/10/11 Gender: Female Account #: [id number] Procedure:                Colonoscopy Indications:              Screening for colorectal malignant neoplasm, This                            is the patient's first colonoscopy Medicines:                Monitored Anesthesia Care Procedure:                Pre-Anesthesia Assessment:                           - Prior to the procedure, a History and Physical                            was performed, and patient medications and                            allergies were reviewed. The patient's tolerance of                            previous anesthesia was also reviewed. The risks                            and benefits of the procedure and the sedation                            options and risks were discussed with the patient.                            All questions were answered, and informed consent                            was obtained. Prior Anticoagulants: The patient has                            taken no previous anticoagulant or antiplatelet                            agents. ASA Grade Assessment: II - A patient with                            mild systemic disease. After reviewing the risks                            and benefits, the patient was deemed in                            satisfactory condition to undergo the procedure.  After obtaining informed consent, the colonoscope                            was passed under direct vision. Throughout the                            procedure, the patient's blood pressure, pulse, and                            oxygen saturations were monitored continuously. The                            Colonoscope was introduced through the anus and                            advanced to the the cecum,  identified by                            appendiceal orifice and ileocecal valve. The                            colonoscopy was performed without difficulty. The                            patient tolerated the procedure well. The quality                            of the bowel preparation was excellent. The                            ileocecal valve, appendiceal orifice, and rectum                            were photographed. The quality of the bowel                            preparation was evaluated using the BBPS [facility]                            Bowel Preparation Scale) with scores of: Right                            Colon = 3, Transverse Colon = 3 and Left Colon = 3                            (entire mucosa seen well with no residual staining,                            small fragments of stool or opaque liquid). The                            total BBPS score equals 9. The bowel preparation  used was SUPREP (though patient vomited after AM                            prep dose). Scope In: 8:04:15 AM Scope Out: 8:25:10 AM Scope Withdrawal Time: 0 hours 15 minutes 11 seconds  Total Procedure Duration: 0 hours 20 minutes 55 seconds  Findings:                 The perianal and digital rectal examinations were                            normal.                           The sigmoid colon was moderately redundant.                           A, 8-10 mm polyp was found in the cecum. The polyp                            was flat (probable SSP by WL and NBI). The polyp                            was removed with a cold snare and then a cold                            biopsy for a few remaining edge pieces. Resection                            and retrieval were complete.                           The exam was otherwise without abnormality on                            direct and retroflexion views. Complications:            No immediate complications. Estimated  Blood Loss:     Estimated blood loss was minimal. Impression:               - Redundant colon.                           - An 8-10 mm polyp in the cecum, removed with a                            cold snare and cold biopsy. Resected and retrieved.                           - The examination was otherwise normal on direct                            and retroflexion views. Recommendation:           - Patient has a contact number available for  emergencies. The signs and symptoms of potential                            delayed complications were discussed with the                            patient. Return to normal activities tomorrow.                            Written discharge instructions were provided to the                            patient.                           - Resume previous diet.                           - Continue present medications.                           - Await pathology results.                           - Repeat colonoscopy is recommended for                            surveillance. The colonoscopy date will be                            determined after pathology results from today's                            exam become available for review. Florian Chauca L. Loletha Carrow, MD 03/04/2017 8:32:01 AM This report has been signed electronically.

## 2017-03-04 NOTE — Progress Notes (Signed)
Pt's states no medical or surgical changes since previsit or office visit. 

## 2017-03-05 ENCOUNTER — Telehealth: Payer: Self-pay

## 2017-03-05 NOTE — Telephone Encounter (Signed)
  Follow up Call-  Call back number 03/04/2017  Post procedure Call Back phone  # [phone]  Permission to leave phone message Yes  Some recent data might be hidden     Patient questions:  Do you have a fever, pain , or abdominal swelling? No. Pain Score  0 *  Have you tolerated food without any problems? Yes.    Have you been able to return to your normal activities? Yes.    Do you have any questions about your discharge instructions: Diet   No. Medications  No. Follow up visit  No.  Do you have questions or concerns about your Care? No.  Actions: * If pain score is 4 or above: No action needed, pain <4.

## 2017-03-09 DIAGNOSIS — M9903 Segmental and somatic dysfunction of lumbar region: Secondary | ICD-10-CM | POA: Diagnosis not present

## 2017-03-10 ENCOUNTER — Encounter: Payer: Self-pay | Admitting: Gastroenterology

## 2017-03-11 ENCOUNTER — Other Ambulatory Visit: Payer: Self-pay | Admitting: Family Medicine

## 2017-03-11 DIAGNOSIS — F411 Generalized anxiety disorder: Secondary | ICD-10-CM

## 2017-03-11 DIAGNOSIS — F410 Panic disorder [episodic paroxysmal anxiety] without agoraphobia: Secondary | ICD-10-CM

## 2017-03-12 DIAGNOSIS — M9903 Segmental and somatic dysfunction of lumbar region: Secondary | ICD-10-CM | POA: Diagnosis not present

## 2017-03-13 ENCOUNTER — Encounter: Payer: Self-pay | Admitting: Family Medicine

## 2017-03-13 DIAGNOSIS — K635 Polyp of colon: Secondary | ICD-10-CM | POA: Insufficient documentation

## 2017-03-16 ENCOUNTER — Encounter: Payer: Self-pay | Admitting: Family Medicine

## 2017-03-16 ENCOUNTER — Ambulatory Visit (INDEPENDENT_AMBULATORY_CARE_PROVIDER_SITE_OTHER): Payer: 59 | Admitting: Family Medicine

## 2017-03-16 VITALS — BP 113/67 | HR 74 | Temp 98.5°F | Resp 20 | Ht 65.0 in | Wt 154.2 lb

## 2017-03-16 DIAGNOSIS — F410 Panic disorder [episodic paroxysmal anxiety] without agoraphobia: Secondary | ICD-10-CM | POA: Diagnosis not present

## 2017-03-16 DIAGNOSIS — F411 Generalized anxiety disorder: Secondary | ICD-10-CM | POA: Diagnosis not present

## 2017-03-16 MED ORDER — PAROXETINE HCL 20 MG PO TABS: 20.0000 mg | ORAL_TABLET | Freq: Every day | ORAL | 1 refills | Status: DC

## 2017-03-16 NOTE — Progress Notes (Signed)
Patient ID: Carla Beltran, female  DOB: [birth year], 52 y.o.   MRN: [id number] Patient Care Team    Relationship Specialty Notifications Start End  [name], DO PCP - General Family Medicine  05/28/16   Doran Stabler, MD Consulting Physician Gastroenterology  03/13/17    Chief Complaint  Patient presents with  . Depression  . Anxiety    Subjective:  Carla Beltran is a 52 y.o.  female present for follow up on  Anxiety/phobia:  Patient reports she is doing well on paxil 20 mg QD. She has noticed weight gain, but admits she has not been as active and has not followed her healthy diet. She has has been thinking about getting back into [employer] and she likes to paint.    Prior note:  Patient reports she is doing well on Paxil 20 mg daily, she states that her husband also believes she is doing much better. She feels she is able to cope with anxiety and pressures better on this medication. She does endorse that she still anxious and talkative but that is her normal personality. She denies any negative side effects to this medication. She would like refills on the medicine today.   Prior note:  Patient presents for establishment today and admits to increased anxiety and panic attacks. She has fears that she has trouble controlling. She worries about everything. She has a strong fear of driving on the [place], especially in areas she is unfamiliar with. She endorses panic attacks that occur during driving and at other times. She states she experiences a increased heart rate and then feeling like she is "out of her body ". She reports increased anxiety in her life especially around times when she is moving, such as currently. She states she's always had anxiety. She reports she did not have a great childhood. Her mother now has dementia and is in [place] in a nursing facility, this also caused her increased anxiety. Her mother with hoarder. She has 2 daughters, which also are  affected by anxiety and are on Celexa. She states her daughters are not affected by his much panic she is. She reports being on Zoloft since 2006. She was initially on 25 mg of Zoloft daily, and eventually increased up to Zoloft 50 mg daily. Prior to moving in August 2017 her prior provider told her to increase to 75 mg daily. She states that she did not like the apathetic side effects she experienced at 75 mg dose, so she decreased back down to the 50 mg dose daily. She reports being seen by counselor many times in the past. She has seek the help of a Marketing executive in [place] Leanor Rubenstein). She has yet to establish with her.  Mood disorder screening: negative.   Depression screen [facility] 2/9 03/16/2017 10/08/2016 10/08/2016 06/25/2016 05/28/2016  Decreased Interest 0 0 0 0 1  Down, Depressed, Hopeless 0 0 0 0 1  PHQ - 2 Score 0 0 0 0 2  Altered sleeping 0 0 - - 0  Tired, decreased energy 0 0 - - 0  Change in appetite 0 0 - - 0  Feeling bad or failure about yourself  0 0 - - 1  Trouble concentrating 0 0 - - 0  Moving slowly or fidgety/restless 0 0 - - 0  Suicidal thoughts 0 0 - - 0  PHQ-9 Score 0 0 - - 3   GAD 7 : Generalized Anxiety Score 03/16/2017 05/28/2016  Nervous, Anxious, on Edge 0 1  Control/stop worrying 0 1  Worry too much - different things 0 1  Trouble relaxing 0 1  Restless 0 0  Easily annoyed or irritable 0 0  Afraid - awful might happen 0 0  Total GAD 7 Score 0 4  Anxiety Difficulty - Very difficult       Immunization History  Administered Date(s) Administered  . Tdap 10/26/2012   Past Medical History:  Diagnosis Date  . Allergy   . Anemia   . Anxiety   . Depression   . Fatigue   . Frequent UTI   . Hyperlipidemia   . Migraine   . Reactive airway disease    patient unaware RAD  . Renal stones   . Rosacea   . Vertigo    was on antivert   Allergies  Allergen Reactions  . Codeine    Past Surgical History:  Procedure Laterality Date  . BREAST BIOPSY   1990   benign  . LASIK  2001  . TONSILLECTOMY AND ADENOIDECTOMY  1985  . WISDOM TOOTH EXTRACTION     Family History  Problem Relation Age of Onset  . Diabetes Mother   . Mental illness Mother        Hoarder  . Dementia Mother   . Asthma Mother   . Depression Mother   . Migraines Mother   . Atrial fibrillation Father   . Hypertension Father   . Diabetes Maternal Aunt   . Hearing loss Maternal Aunt   . Stroke Maternal Grandmother   . Arthritis Maternal Grandfather   . Heart disease Maternal Grandfather   . Colon polyps Maternal Grandfather   . Colon polyps Paternal Grandfather   . Colon cancer Neg Hx   . Esophageal cancer Neg Hx   . Rectal cancer Neg Hx   . Stomach cancer Neg Hx    Social History   Socioeconomic History  . Marital status: Married    Spouse name: Carla Beltran  . Number of children: 2  . Years of education: 29  . Highest education level: Not on file  Social Needs  . Financial resource strain: Not on file  . Food insecurity - worry: Not on file  . Food insecurity - inability: Not on file  . Transportation needs - medical: Not on file  . Transportation needs - non-medical: Not on file  Occupational History  . Occupation: SAHM  Tobacco Use  . Smoking status: Never Smoker  . Smokeless tobacco: Never Used  Substance and Sexual Activity  . Alcohol use: No  . Drug use: No  . Sexual activity: Yes    Partners: Male    Birth control/protection: Surgical    Comment: Married; husband vasectomy  Other Topics Concern  . Not on file  Social History Narrative   Married to [place]. Two adult children Carla Beltran and Carla Beltran (in college).    Moved from [place] [facility]  area) 09/2015.    SAHM. College educated.    Drinks one cup of coffee a day.   Takes a daily vitamin.   Wears seatbelts, bicycle helmet and smoke detector in the home.   Tries to exercise routinely.   Feels safe in her relationships.   Allergies as of 03/16/2017      Reactions   Codeine         Medication List        Accurate as of 03/16/17  2:53 PM. Always use your most recent med list.  nitrofurantoin (macrocrystal-monohydrate) 100 MG capsule Commonly known as:  MACROBID Take 1 capsule (100 mg total) by mouth as needed. 1 capsule after intercourse to prevent UTI   PARoxetine 20 MG tablet Commonly known as:  PAXIL Take 1 tablet (20 mg total) by mouth daily.   PROBIOTIC ACIDOPHILUS PO Take 1 capsule by mouth daily.        No results found for this or any previous visit (from the past 2160 hour(s)).  Patient was never admitted.  ROS: 14 pt review of systems performed and negative (unless mentioned in an HPI)  Objective: BP 113/67 (BP Location: Left Arm, Patient Position: Sitting, Cuff Size: Normal)   Pulse 74   Temp 98.5 F (36.9 C)   Resp 20   Ht 5\' 5"  (1.651 m)   Wt 154 lb 4 oz (70 kg)   LMP 12/31/2014   SpO2 98%   BMI 25.67 kg/m   Gen: Afebrile. No acute distress.  HENT: AT. [place].  MMM.  Psych: Normal affect, dress and demeanor. Normal speech. Normal thought content and judgment.  Assessment/plan: Patricia Fargo is a 52 y.o. female present for establish care Generalized anxiety disorder/panic attacks - Stable today. Refills on paxil 20 g QD provided.  - encouraged her to restart boot camp and buy a canvas to start painting. She is bored now that the wedding planning for her daughter is completed and her other daughter left for college.  - PARoxetine (PAXIL) 20 MG tablet; Take 1 tablet (20 mg total) by mouth daily.  Dispense: 90 tablet; Refill: 1 - continue Marketing executive.. - F/U every 6 months, unless needed sooner  Return in about 6 months (around 09/13/2017).  > 25 minutes spent with patient, >50% of time spent face to face counseling   Electronically signed by: Howard Pouch, [place]

## 2017-03-16 NOTE — Patient Instructions (Signed)
I am glad to see you today.  I am glad you are doing well.   I have refilled your medication for 6 months.  Follow up 6 months as long as doing well.     Try to get back into boot camp. Buy a canvas and just start!

## 2017-03-19 DIAGNOSIS — M9903 Segmental and somatic dysfunction of lumbar region: Secondary | ICD-10-CM | POA: Diagnosis not present

## 2017-03-31 DIAGNOSIS — M9903 Segmental and somatic dysfunction of lumbar region: Secondary | ICD-10-CM | POA: Diagnosis not present

## 2017-04-02 DIAGNOSIS — M9903 Segmental and somatic dysfunction of lumbar region: Secondary | ICD-10-CM | POA: Diagnosis not present

## 2017-04-14 DIAGNOSIS — M9903 Segmental and somatic dysfunction of lumbar region: Secondary | ICD-10-CM | POA: Diagnosis not present

## 2017-04-16 DIAGNOSIS — M9903 Segmental and somatic dysfunction of lumbar region: Secondary | ICD-10-CM | POA: Diagnosis not present

## 2017-04-28 DIAGNOSIS — M9903 Segmental and somatic dysfunction of lumbar region: Secondary | ICD-10-CM | POA: Diagnosis not present

## 2017-05-19 DIAGNOSIS — M9903 Segmental and somatic dysfunction of lumbar region: Secondary | ICD-10-CM | POA: Diagnosis not present

## 2017-07-21 ENCOUNTER — Other Ambulatory Visit: Payer: Self-pay | Admitting: Family Medicine

## 2017-07-21 DIAGNOSIS — F411 Generalized anxiety disorder: Secondary | ICD-10-CM

## 2017-07-21 DIAGNOSIS — F410 Panic disorder [episodic paroxysmal anxiety] without agoraphobia: Secondary | ICD-10-CM

## 2017-09-14 ENCOUNTER — Encounter: Payer: Self-pay | Admitting: Family Medicine

## 2017-09-14 ENCOUNTER — Ambulatory Visit (INDEPENDENT_AMBULATORY_CARE_PROVIDER_SITE_OTHER): Payer: 59 | Admitting: Family Medicine

## 2017-09-14 VITALS — BP 131/83 | HR 75 | Temp 98.4°F | Resp 20 | Ht 65.5 in | Wt 152.0 lb

## 2017-09-14 DIAGNOSIS — F410 Panic disorder [episodic paroxysmal anxiety] without agoraphobia: Secondary | ICD-10-CM

## 2017-09-14 DIAGNOSIS — F411 Generalized anxiety disorder: Secondary | ICD-10-CM

## 2017-09-14 MED ORDER — PAROXETINE HCL 20 MG PO TABS: 20.0000 mg | ORAL_TABLET | Freq: Every day | ORAL | 1 refills | Status: DC

## 2017-09-14 NOTE — Patient Instructions (Signed)
I am glad you are doing so well.  Try the light box in the winter time.   Follow up in 6 months unless you need me sooner.

## 2017-09-14 NOTE — Progress Notes (Signed)
Patient ID: Carla Beltran, female  DOB: [birth year], 52 y.o.   MRN: [id number] Patient Care Team    Relationship Specialty Notifications Start End  [name], DO PCP - General Family Medicine  05/28/16   Doran Stabler, MD Consulting Physician Gastroenterology  03/13/17    Chief Complaint  Patient presents with  . Anxiety    Subjective:  Carla Beltran is a 52 y.o.  female present for follow up on  Anxiety/phobia:  Currently doing well on Paxil 20 mg Qd. She is wondering if wintertime she does not suffer from some seasonal affective disorder on top of her depression and anxiety.  Prior note:  Patient reports she is doing well on paxil 20 mg QD. She has noticed weight gain, but admits she has not been as active and has not followed her healthy diet. She has has been thinking about getting back into [employer] and she likes to paint.   Prior note:  Patient reports she is doing well on Paxil 20 mg daily, she states that her husband also believes she is doing much better. She feels she is able to cope with anxiety and pressures better on this medication. She does endorse that she still anxious and talkative but that is her normal personality. She denies any negative side effects to this medication. She would like refills on the medicine today.   Prior note:  Patient presents for establishment today and admits to increased anxiety and panic attacks. She has fears that she has trouble controlling. She worries about everything. She has a strong fear of [place] the [place], especially in areas she is unfamiliar with. She endorses panic attacks that occur during driving and at other times. She states she experiences a increased heart rate and then feeling like she is "out of her body ". She reports increased anxiety in her life especially around times when she is moving, such as currently. She states she's always had anxiety. She reports she did not have a great childhood. Her mother  now has dementia and is in [place] in a nursing facility, this also caused her increased anxiety. Her mother with hoarder. She has 2 daughters, which also are affected by anxiety and are on Celexa. She states her daughters are not affected by his much panic she is. She reports being on Zoloft since 2006. She was initially on 25 mg of Zoloft daily, and eventually increased up to Zoloft 50 mg daily. Prior to moving in August 2017 her prior provider told her to increase to 75 mg daily. She states that she did not like the apathetic side effects she experienced at 75 mg dose, so she decreased back down to the 50 mg dose daily. She reports being seen by counselor many times in the past. She has seek the help of a Marketing executive in [place] Leanor Rubenstein). She has yet to establish with her.  Mood disorder screening: negative.   Depression screen [facility] 2/9 03/16/2017 10/08/2016 10/08/2016 06/25/2016 05/28/2016  Decreased Interest 0 0 0 0 1  Down, Depressed, Hopeless 0 0 0 0 1  PHQ - 2 Score 0 0 0 0 2  Altered sleeping 0 0 - - 0  Tired, decreased energy 0 0 - - 0  Change in appetite 0 0 - - 0  Feeling bad or failure about yourself  0 0 - - 1  Trouble concentrating 0 0 - - 0  Moving slowly or fidgety/restless 0 0 - -  0  Suicidal thoughts 0 0 - - 0  PHQ-9 Score 0 0 - - 3   GAD 7 : Generalized Anxiety Score 09/14/2017 03/16/2017 05/28/2016  Nervous, Anxious, on Edge 0 0 1  Control/stop worrying 0 0 1  Worry too much - different things 0 0 1  Trouble relaxing 0 0 1  Restless 0 0 0  Easily annoyed or irritable 0 0 0  Afraid - awful might happen 0 0 0  Total GAD 7 Score 0 0 4  Anxiety Difficulty Not difficult at all - Very difficult       Immunization History  Administered Date(s) Administered  . Tdap 10/26/2012   Past Medical History:  Diagnosis Date  . Allergy   . Anemia   . Anxiety   . Depression   . Fatigue   . Frequent UTI   . Hyperlipidemia   . Migraine   . Reactive airway  disease    patient unaware RAD  . Renal stones   . Rosacea   . Vertigo    was on antivert   Allergies  Allergen Reactions  . Codeine    Past Surgical History:  Procedure Laterality Date  . BREAST BIOPSY  1990   benign  . LASIK  2001  . TONSILLECTOMY AND ADENOIDECTOMY  1985  . WISDOM TOOTH EXTRACTION     Family History  Problem Relation Age of Onset  . Diabetes Mother   . Mental illness Mother        Hoarder  . Dementia Mother   . Asthma Mother   . Depression Mother   . Migraines Mother   . Atrial fibrillation Father   . Hypertension Father   . Diabetes Maternal Aunt   . Hearing loss Maternal Aunt   . Stroke Maternal Grandmother   . Arthritis Maternal Grandfather   . Heart disease Maternal Grandfather   . Colon polyps Maternal Grandfather   . Colon polyps Paternal Grandfather   . Colon cancer Neg Hx   . Esophageal cancer Neg Hx   . Rectal cancer Neg Hx   . Stomach cancer Neg Hx    Social History   Socioeconomic History  . Marital status: Married    Spouse name: [name] Reading  . Number of children: 2  . Years of education: 94  . Highest education level: Not on file  Occupational History  . Occupation: [employer]  Social Needs  . Financial resource strain: Not on file  . Food insecurity:    Worry: Not on file    Inability: Not on file  . Transportation needs:    Medical: Not on file    Non-medical: Not on file  Tobacco Use  . Smoking status: Never Smoker  . Smokeless tobacco: Never Used  Substance and Sexual Activity  . Alcohol use: No  . Drug use: No  . Sexual activity: Yes    Partners: Male    Birth control/protection: Surgical    Comment: Married; husband vasectomy  Lifestyle  . Physical activity:    Days per week: Not on file    Minutes per session: Not on file  . Stress: Not on file  Relationships  . Social connections:    Talks on phone: Not on file    Gets together: Not on file    Attends religious service: Not on file    Active member of club  or organization: Not on file    Attends meetings of clubs or organizations: Not on file  Relationship status: Not on file  . Intimate partner violence:    Fear of current or ex partner: Not on file    Emotionally abused: Not on file    Physically abused: Not on file    Forced sexual activity: Not on file  Other Topics Concern  . Not on file  Social History Narrative   Married to [place]. Two adult children [name] and [name] (in college).    Moved from [facility]  area) 09/2015.    SAHM. College educated.    Drinks one cup of coffee a day.   Takes a daily vitamin.   Wears seatbelts, bicycle helmet and smoke detector in the home.   Tries to exercise routinely.   Feels safe in her relationships.   Allergies as of 09/14/2017      Reactions   Codeine       Medication List        Accurate as of 09/14/17  2:42 PM. Always use your most recent med list.          nitrofurantoin (macrocrystal-monohydrate) 100 MG capsule Commonly known as:  MACROBID Take 1 capsule (100 mg total) by mouth as needed. 1 capsule after intercourse to prevent UTI   PARoxetine 20 MG tablet Commonly known as:  PAXIL Take 1 tablet (20 mg total) by mouth daily.   PROBIOTIC ACIDOPHILUS PO Take 1 capsule by mouth daily.        No results found for this or any previous visit (from the past 2160 hour(s)).  Patient was never admitted.  ROS: 14 pt review of systems performed and negative (unless mentioned in an HPI)  Objective: BP 131/83 (BP Location: Right Arm, Patient Position: Sitting, Cuff Size: Normal)   Pulse 75   Temp 98.4 F (36.9 C)   Resp 20   Ht 5' 5.5" (1.664 m)   Wt 152 lb (68.9 kg)   LMP 12/31/2014   SpO2 98%   BMI 24.91 kg/m   Gen: Afebrile. No acute distress. Nontoxic in presentation. HENT: AT. [place].  MMM.  Eyes:Pupils Equal Round Reactive to light, Extraocular movements intact,  Conjunctiva without redness, discharge or icterus. Neck/lymp/endocrine: Supple,no  lymphadenopathy, no thyromegaly CV: RRR no murmur, no edema, +2/4 P posterior tibialis pulses Chest: CTAB, no wheeze or crackles Neuro:  Normal gait. PERLA. EOMi. Alert. Oriented x3 Psych: Normal affect, dress and demeanor. Normal speech. Normal thought content and judgment.  Assessment/plan: Carla Beltran is a 52 y.o. female present for establish care Generalized anxiety disorder/panic attacks - Stable today. Refills on paxil 20 g QD provided.  - encouraged  To try a light box therapy in the winter.  - PARoxetine (PAXIL) 20 MG tablet; Take 1 tablet (20 mg total) by mouth daily.  Dispense: 90 tablet; Refill: 1 - continue Marketing executive.. - F/U every 6 months, unless needed sooner  Return in about 6 months (around 03/17/2018) for depression and anxiety.  > 25 minutes spent with patient, >50% of time spent face to face counseling   Electronically signed by: Howard Pouch, [place]

## 2017-09-16 ENCOUNTER — Ambulatory Visit (INDEPENDENT_AMBULATORY_CARE_PROVIDER_SITE_OTHER): Payer: 59 | Admitting: Family Medicine

## 2017-09-16 ENCOUNTER — Encounter: Payer: Self-pay | Admitting: Family Medicine

## 2017-09-16 VITALS — BP 114/68 | HR 76 | Temp 98.4°F | Resp 20 | Ht 65.5 in | Wt 151.0 lb

## 2017-09-16 DIAGNOSIS — Z23 Encounter for immunization: Secondary | ICD-10-CM | POA: Diagnosis not present

## 2017-09-16 DIAGNOSIS — R350 Frequency of micturition: Secondary | ICD-10-CM | POA: Diagnosis not present

## 2017-09-16 LAB — POC URINALSYSI DIPSTICK (AUTOMATED)
Bilirubin, UA: NEGATIVE
Glucose, UA: NEGATIVE
Ketones, UA: NEGATIVE
Leukocytes, UA: NEGATIVE
NITRITE UA: NEGATIVE
PH UA: 6 (ref 5.0–8.0)
Protein, UA: NEGATIVE
RBC UA: NEGATIVE
Spec Grav, UA: 1.025 (ref 1.010–1.025)
UROBILINOGEN UA: 0.2 U/dL

## 2017-09-16 NOTE — Progress Notes (Signed)
Carla Beltran , September 01, 1965, 52 y.o., female MRN: [id number] Patient Care Team    Relationship Specialty Notifications Start End  [name], DO PCP - General Family Medicine  05/28/16   Doran Stabler, MD Consulting Physician Gastroenterology  03/13/17     Chief Complaint  Patient presents with  . Urinary Frequency    headache,chills     Subjective: Pt presents for an OV with complaints of urinary frequency of 2 days duration.  Associated symptoms include headache and chills. She has had frequent UTI in the past. She is prescribed Macrobid prophylaxis (post coital) and does not routinely use. She denies nausea, vomit, abd pain, URI sx or low back pain.  Pt has tried hydrating to ease their symptoms.   Depression screen [facility] 2/9 03/16/2017 10/08/2016 10/08/2016 06/25/2016 05/28/2016  Decreased Interest 0 0 0 0 1  Down, Depressed, Hopeless 0 0 0 0 1  PHQ - 2 Score 0 0 0 0 2  Altered sleeping 0 0 - - 0  Tired, decreased energy 0 0 - - 0  Change in appetite 0 0 - - 0  Feeling bad or failure about yourself  0 0 - - 1  Trouble concentrating 0 0 - - 0  Moving slowly or fidgety/restless 0 0 - - 0  Suicidal thoughts 0 0 - - 0  PHQ-9 Score 0 0 - - 3    Allergies  Allergen Reactions  . Codeine    Social History   Tobacco Use  . Smoking status: Never Smoker  . Smokeless tobacco: Never Used  Substance Use Topics  . Alcohol use: No   Past Medical History:  Diagnosis Date  . Allergy   . Anemia   . Anxiety   . Depression   . Fatigue   . Frequent UTI   . Hyperlipidemia   . Migraine   . Reactive airway disease    patient unaware RAD  . Renal stones   . Rosacea   . Vertigo    was on antivert   Past Surgical History:  Procedure Laterality Date  . BREAST BIOPSY  1990   benign  . LASIK  2001  . TONSILLECTOMY AND ADENOIDECTOMY  1985  . WISDOM TOOTH EXTRACTION     Family History  Problem Relation Age of Onset  . Diabetes Mother   . Mental illness Mother    Hoarder  . Dementia Mother   . Asthma Mother   . Depression Mother   . Migraines Mother   . Atrial fibrillation Father   . Hypertension Father   . Diabetes Maternal Aunt   . Hearing loss Maternal Aunt   . Stroke Maternal Grandmother   . Arthritis Maternal Grandfather   . Heart disease Maternal Grandfather   . Colon polyps Maternal Grandfather   . Colon polyps Paternal Grandfather   . Colon cancer Neg Hx   . Esophageal cancer Neg Hx   . Rectal cancer Neg Hx   . Stomach cancer Neg Hx    Allergies as of 09/16/2017      Reactions   Codeine       Medication List        Accurate as of 09/16/17  2:22 PM. Always use your most recent med list.          nitrofurantoin (macrocrystal-monohydrate) 100 MG capsule Commonly known as:  MACROBID Take 1 capsule (100 mg total) by mouth as needed. 1 capsule after intercourse to prevent UTI  PARoxetine 20 MG tablet Commonly known as:  PAXIL Take 1 tablet (20 mg total) by mouth daily.   PROBIOTIC ACIDOPHILUS PO Take 1 capsule by mouth daily.       All past medical history, surgical history, allergies, family history, immunizations andmedications were updated in the EMR today and reviewed under the history and medication portions of their EMR.     ROS: Negative, with the exception of above mentioned in HPI   Objective:  BP 114/68 (BP Location: Right Arm, Patient Position: Sitting, Cuff Size: Normal)   Pulse 76   Temp 98.4 F (36.9 C)   Resp 20   Ht 5' 5.5" (1.664 m)   Wt 151 lb (68.5 kg)   LMP 12/31/2014   SpO2 96%   BMI 24.75 kg/m  Body mass index is 24.75 kg/m. Gen: Afebrile. No acute distress. Nontoxic in appearance, well developed, well nourished.  HENT: AT. [place].  MMM Eyes:Pupils Equal Round Reactive to light, Extraocular movements intact,  Conjunctiva without redness, discharge or icterus. CV: RRR  Chest: CTAB, no wheeze or crackles.   Abd: Soft. NTND. BS + MSk: No CVA tenderness.  Neuro: Normal gait. PERLA. EOMi.  Alert. Oriented x3   No exam data present No results found. Results for orders placed or performed in visit on 09/16/17 (from the past 24 hour(s))  POCT Urinalysis Dipstick (Automated)     Status: Normal   Collection Time: 09/16/17  2:15 PM  Result Value Ref Range   Color, UA yellow    Clarity, UA clear    Glucose, UA Negative Negative   Bilirubin, UA Negative    Ketones, UA Negative    Spec Grav, UA 1.025 1.010 - 1.025   Blood, UA Negative    pH, UA 6.0 5.0 - 8.0   Protein, UA Negative Negative   Urobilinogen, UA 0.2 0.2 or 1.0 E.U./dL   Nitrite, UA Negative    Leukocytes, UA Negative Negative    Assessment/Plan: Carla Beltran is a 52 y.o. female present for OV for  Urinary frequency - start Macrobid (she has at home). Hydrate.  - POCT Urinalysis Dipstick (Automated)--> appeared negative - Urine Culture - pt will be called w/ results once available.   Immunization due - Varicella-zoster vaccine IM (Shingrix) #1 provided today.  - Nurse visit for #2 in 2-6 mos.    Reviewed expectations re: course of current medical issues.  Discussed self-management of symptoms.  Outlined signs and symptoms indicating need for more acute intervention.  Patient verbalized understanding and all questions were answered.  Patient received an After-Visit Summary.    Orders Placed This Encounter  Procedures  . POCT Urinalysis Dipstick (Automated)     Note is dictated utilizing voice recognition software. Although note has been proof read prior to signing, occasional typographical errors still can be missed. If any questions arise, please do not hesitate to call for verification.   electronically signed by:  Howard Pouch, DO  [place]

## 2017-09-16 NOTE — Patient Instructions (Signed)
Take the Macrobid. We will send the urine culture and call you when it results.  Your urine today does not look infectious.  Hydrate!

## 2017-09-17 LAB — URINE CULTURE
MICRO NUMBER: 90905924
SPECIMEN QUALITY: ADEQUATE

## 2017-10-12 ENCOUNTER — Encounter: Payer: 59 | Admitting: Family Medicine

## 2017-10-23 ENCOUNTER — Ambulatory Visit (INDEPENDENT_AMBULATORY_CARE_PROVIDER_SITE_OTHER): Payer: 59 | Admitting: Family Medicine

## 2017-10-23 ENCOUNTER — Encounter: Payer: Self-pay | Admitting: Family Medicine

## 2017-10-23 ENCOUNTER — Telehealth: Payer: Self-pay | Admitting: Family Medicine

## 2017-10-23 ENCOUNTER — Other Ambulatory Visit: Payer: Self-pay | Admitting: Family Medicine

## 2017-10-23 VITALS — BP 114/73 | HR 87 | Temp 98.4°F | Resp 20 | Ht 66.0 in | Wt 148.2 lb

## 2017-10-23 DIAGNOSIS — E785 Hyperlipidemia, unspecified: Secondary | ICD-10-CM

## 2017-10-23 DIAGNOSIS — Z1239 Encounter for other screening for malignant neoplasm of breast: Secondary | ICD-10-CM

## 2017-10-23 DIAGNOSIS — Z1231 Encounter for screening mammogram for malignant neoplasm of breast: Secondary | ICD-10-CM

## 2017-10-23 DIAGNOSIS — Z01419 Encounter for gynecological examination (general) (routine) without abnormal findings: Secondary | ICD-10-CM | POA: Insufficient documentation

## 2017-10-23 DIAGNOSIS — Z131 Encounter for screening for diabetes mellitus: Secondary | ICD-10-CM | POA: Diagnosis not present

## 2017-10-23 DIAGNOSIS — Z0000 Encounter for general adult medical examination without abnormal findings: Secondary | ICD-10-CM

## 2017-10-23 DIAGNOSIS — F411 Generalized anxiety disorder: Secondary | ICD-10-CM

## 2017-10-23 DIAGNOSIS — K635 Polyp of colon: Secondary | ICD-10-CM

## 2017-10-23 LAB — CBC WITH DIFFERENTIAL/PLATELET
BASOS PCT: 1 % (ref 0.0–3.0)
Basophils Absolute: 0 10*3/uL (ref 0.0–0.1)
EOS PCT: 1.5 % (ref 0.0–5.0)
Eosinophils Absolute: 0.1 10*3/uL (ref 0.0–0.7)
HCT: 41.8 % (ref 36.0–46.0)
HEMOGLOBIN: 14 g/dL (ref 12.0–15.0)
LYMPHS ABS: 1.5 10*3/uL (ref 0.7–4.0)
Lymphocytes Relative: 30.6 % (ref 12.0–46.0)
MCHC: 33.5 g/dL (ref 30.0–36.0)
MCV: 90.5 fl (ref 78.0–100.0)
MONO ABS: 0.4 10*3/uL (ref 0.1–1.0)
MONOS PCT: 9.2 % (ref 3.0–12.0)
NEUTROS PCT: 57.7 % (ref 43.0–77.0)
Neutro Abs: 2.8 10*3/uL (ref 1.4–7.7)
Platelets: 300 10*3/uL (ref 150.0–400.0)
RBC: 4.62 Mil/uL (ref 3.87–5.11)
RDW: 13.9 % (ref 11.5–15.5)
WBC: 4.8 10*3/uL (ref 4.0–10.5)

## 2017-10-23 LAB — COMPREHENSIVE METABOLIC PANEL
ALBUMIN: 4.7 g/dL (ref 3.5–5.2)
ALK PHOS: 73 U/L (ref 39–117)
ALT: 16 U/L (ref 0–35)
AST: 20 U/L (ref 0–37)
BUN: 20 mg/dL (ref 6–23)
CHLORIDE: 104 meq/L (ref 96–112)
CO2: 30 mEq/L (ref 19–32)
Calcium: 9.5 mg/dL (ref 8.4–10.5)
Creatinine, Ser: 0.73 mg/dL (ref 0.40–1.20)
GFR: 88.76 mL/min (ref >60.00)
Glucose, Bld: 93 mg/dL (ref 70–99)
POTASSIUM: 4.6 meq/L (ref 3.5–5.1)
SODIUM: 141 meq/L (ref 135–145)
TOTAL PROTEIN: 6.8 g/dL (ref 6.0–8.3)
Total Bilirubin: 0.6 mg/dL (ref 0.2–1.2)

## 2017-10-23 LAB — LIPID PANEL
CHOLESTEROL: 301 mg/dL — AB (ref 0–200)
HDL: 80.2 mg/dL (ref >39.00)
LDL CALC: 206 mg/dL — AB (ref 0–99)
NonHDL: 220.72
Total CHOL/HDL Ratio: 4
Triglycerides: 72 mg/dL (ref 0.0–149.0)
VLDL: 14.4 mg/dL (ref 0.0–40.0)

## 2017-10-23 LAB — HEMOGLOBIN A1C: HEMOGLOBIN A1C: 5.5 % (ref 4.6–6.5)

## 2017-10-23 LAB — TSH: TSH: 2.02 u[IU]/mL (ref 0.35–4.50)

## 2017-10-23 MED ORDER — EZETIMIBE 10 MG PO TABS: 10.0000 mg | ORAL_TABLET | Freq: Every day | ORAL | 3 refills | Status: DC

## 2017-10-23 NOTE — Patient Instructions (Addendum)
Schedule nurse visit for shingrix  #2 after Oct 1.  The foot mass on image is normal. This means it is probably a cyst.    Health Maintenance, Female Adopting a healthy lifestyle and getting preventive care can go a long way to promote health and wellness. Talk with your health care provider about what schedule of regular examinations is right for you. This is a good chance for you to check in with your provider about disease prevention and staying healthy. In between checkups, there are plenty of things you can do on your own. Experts have done a lot of research about which lifestyle changes and preventive measures are most likely to keep you healthy. Ask your health care provider for more information. Weight and diet Eat a healthy diet  Be sure to include plenty of vegetables, fruits, low-fat dairy products, and lean protein.  Do not eat a lot of foods high in solid fats, added sugars, or salt.  Get regular exercise. This is one of the most important things you can do for your health. ? Most adults should exercise for at least 150 minutes each week. The exercise should increase your heart rate and make you sweat (moderate-intensity exercise). ? Most adults should also do strengthening exercises at least twice a week. This is in addition to the moderate-intensity exercise.  Maintain a healthy weight  Body mass index (BMI) is a measurement that can be used to identify possible weight problems. It estimates body fat based on height and weight. Your health care provider can help determine your BMI and help you achieve or maintain a healthy weight.  For females 39 years of age and older: ? A BMI below 18.5 is considered underweight. ? A BMI of 18.5 to 24.9 is normal. ? A BMI of 25 to 29.9 is considered overweight. ? A BMI of 30 and above is considered obese.  Watch levels of cholesterol and blood lipids  You should start having your blood tested for lipids and cholesterol at 52 years of  age, then have this test every 5 years.  You may need to have your cholesterol levels checked more often if: ? Your lipid or cholesterol levels are high. ? You are older than 52 years of age. ? You are at high risk for heart disease.  Cancer screening Lung Cancer  Lung cancer screening is recommended for adults 7-75 years old who are at high risk for lung cancer because of a history of smoking.  A yearly low-dose CT scan of the lungs is recommended for people who: ? Currently smoke. ? Have quit within the past 15 years. ? Have at least a 30-pack-year history of smoking. A pack year is smoking an average of one pack of cigarettes a day for 1 year.  Yearly screening should continue until it has been 15 years since you quit.  Yearly screening should stop if you develop a health problem that would prevent you from having lung cancer treatment.  Breast Cancer  Practice breast self-awareness. This means understanding how your breasts normally appear and feel.  It also means doing regular breast self-exams. Let your health care provider know about any changes, no matter how small.  If you are in your 20s or 30s, you should have a clinical breast exam (CBE) by a health care provider every 1-3 years as part of a regular health exam.  If you are 64 or older, have a CBE every year. Also consider having a breast X-ray (mammogram)  every year.  If you have a family history of breast cancer, talk to your health care provider about genetic screening.  If you are at high risk for breast cancer, talk to your health care provider about having an MRI and a mammogram every year.  Breast cancer gene (BRCA) assessment is recommended for women who have family members with BRCA-related cancers. BRCA-related cancers include: ? Breast. ? Ovarian. ? Tubal. ? Peritoneal cancers.  Results of the assessment will determine the need for genetic counseling and BRCA1 and BRCA2 testing.  Cervical  Cancer Your health care provider may recommend that you be screened regularly for cancer of the pelvic organs (ovaries, uterus, and vagina). This screening involves a pelvic examination, including checking for microscopic changes to the surface of your cervix (Pap test). You may be encouraged to have this screening done every 3 years, beginning at age 45.  For women ages 53-65, health care providers may recommend pelvic exams and Pap testing every 3 years, or they may recommend the Pap and pelvic exam, combined with testing for human papilloma virus (HPV), every 5 years. Some types of HPV increase your risk of cervical cancer. Testing for HPV may also be done on women of any age with unclear Pap test results.  Other health care providers may not recommend any screening for nonpregnant women who are considered low risk for pelvic cancer and who do not have symptoms. Ask your health care provider if a screening pelvic exam is right for you.  If you have had past treatment for cervical cancer or a condition that could lead to cancer, you need Pap tests and screening for cancer for at least 20 years after your treatment. If Pap tests have been discontinued, your risk factors (such as having a new sexual partner) need to be reassessed to determine if screening should resume. Some women have medical problems that increase the chance of getting cervical cancer. In these cases, your health care provider may recommend more frequent screening and Pap tests.  Colorectal Cancer  This type of cancer can be detected and often prevented.  Routine colorectal cancer screening usually begins at 52 years of age and continues through 52 years of age.  Your health care provider may recommend screening at an earlier age if you have risk factors for colon cancer.  Your health care provider may also recommend using home test kits to check for hidden blood in the stool.  A small camera at the end of a tube can be used to  examine your colon directly (sigmoidoscopy or colonoscopy). This is done to check for the earliest forms of colorectal cancer.  Routine screening usually begins at age 74.  Direct examination of the colon should be repeated every 5-10 years through 52 years of age. However, you may need to be screened more often if early forms of precancerous polyps or small growths are found.  Skin Cancer  Check your skin from head to toe regularly.  Tell your health care provider about any new moles or changes in moles, especially if there is a change in a mole's shape or color.  Also tell your health care provider if you have a mole that is larger than the size of a pencil eraser.  Always use sunscreen. Apply sunscreen liberally and repeatedly throughout the day.  Protect yourself by wearing long sleeves, pants, a wide-brimmed hat, and sunglasses whenever you are outside.  Heart disease, diabetes, and high blood pressure  High blood pressure causes  heart disease and increases the risk of stroke. High blood pressure is more likely to develop in: ? People who have blood pressure in the high end of the normal range (130-139/85-89 mm Hg). ? People who are overweight or obese. ? People who are African American.  If you are 51-35 years of age, have your blood pressure checked every 3-5 years. If you are 45 years of age or older, have your blood pressure checked every year. You should have your blood pressure measured twice-once when you are at a hospital or clinic, and once when you are not at a hospital or clinic. Record the average of the two measurements. To check your blood pressure when you are not at a hospital or clinic, you can use: ? An automated blood pressure machine at a pharmacy. ? A home blood pressure monitor.  If you are between 108 years and 69 years old, ask your health care provider if you should take aspirin to prevent strokes.  Have regular diabetes screenings. This involves taking a  blood sample to check your fasting blood sugar level. ? If you are at a normal weight and have a low risk for diabetes, have this test once every three years after 52 years of age. ? If you are overweight and have a high risk for diabetes, consider being tested at a younger age or more often. Preventing infection Hepatitis B  If you have a higher risk for hepatitis B, you should be screened for this virus. You are considered at high risk for hepatitis B if: ? You were born in a country where hepatitis B is common. Ask your health care provider which countries are considered high risk. ? Your parents were born in a high-risk country, and you have not been immunized against hepatitis B (hepatitis B vaccine). ? You have HIV or AIDS. ? You use needles to inject street drugs. ? You live with someone who has hepatitis B. ? You have had sex with someone who has hepatitis B. ? You get hemodialysis treatment. ? You take certain medicines for conditions, including cancer, organ transplantation, and autoimmune conditions.  Hepatitis C  Blood testing is recommended for: ? Everyone born from [birth year] through 1965. ? Anyone with known risk factors for hepatitis C.  Sexually transmitted infections (STIs)  You should be screened for sexually transmitted infections (STIs) including gonorrhea and chlamydia if: ? You are sexually active and are younger than 52 years of age. ? You are older than 52 years of age and your health care provider tells you that you are at risk for this type of infection. ? Your sexual activity has changed since you were last screened and you are at an increased risk for chlamydia or gonorrhea. Ask your health care provider if you are at risk.  If you do not have HIV, but are at risk, it may be recommended that you take a prescription medicine daily to prevent HIV infection. This is called pre-exposure prophylaxis (PrEP). You are considered at risk if: ? You are sexually active and  do not regularly use condoms or know the HIV status of your partner(s). ? You take drugs by injection. ? You are sexually active with a partner who has HIV.  Talk with your health care provider about whether you are at high risk of being infected with HIV. If you choose to begin PrEP, you should first be tested for HIV. You should then be tested every 3 months for as long as  you are taking PrEP. Pregnancy  If you are premenopausal and you may become pregnant, ask your health care provider about preconception counseling.  If you may become pregnant, take 400 to 800 micrograms (mcg) of folic acid every day.  If you want to prevent pregnancy, talk to your health care provider about birth control (contraception). Osteoporosis and menopause  Osteoporosis is a disease in which the bones lose minerals and strength with aging. This can result in serious bone fractures. Your risk for osteoporosis can be identified using a bone density scan.  If you are 77 years of age or older, or if you are at risk for osteoporosis and fractures, ask your health care provider if you should be screened.  Ask your health care provider whether you should take a calcium or vitamin D supplement to lower your risk for osteoporosis.  Menopause may have certain physical symptoms and risks.  Hormone replacement therapy may reduce some of these symptoms and risks. Talk to your health care provider about whether hormone replacement therapy is right for you. Follow these instructions at home:  Schedule regular health, dental, and eye exams.  Stay current with your immunizations.  Do not use any tobacco products including cigarettes, chewing tobacco, or electronic cigarettes.  If you are pregnant, do not drink alcohol.  If you are breastfeeding, limit how much and how often you drink alcohol.  Limit alcohol intake to no more than 1 drink per day for nonpregnant women. One drink equals 12 ounces of beer, 5 ounces of  wine, or 1 ounces of hard liquor.  Do not use street drugs.  Do not share needles.  Ask your health care provider for help if you need support or information about quitting drugs.  Tell your health care provider if you often feel depressed.  Tell your health care provider if you have ever been abused or do not feel safe at home. This information is not intended to replace advice given to you by your health care provider. Make sure you discuss any questions you have with your health care provider. Document Released: 08/19/2010 Document Revised: 07/12/2015 Document Reviewed: 11/07/2014 Elsevier Interactive Patient Education  [employer].

## 2017-10-23 NOTE — Progress Notes (Signed)
Patient ID: Carla Beltran, female  DOB: 03/29/1965, 52 y.o.   MRN: [id number] Patient Care Team    Relationship Specialty Notifications Start End  [name], DO PCP - General Family Medicine  05/28/16   Doran Stabler, MD Consulting Physician Gastroenterology  03/13/17     Chief Complaint  Patient presents with  . Annual Exam    Subjective:  Carla Beltran is a 52 y.o.  Female  present for CPE . All past medical history, surgical history, allergies, family history, immunizations, medications and social history were updated in the electronic medical record today. All recent labs, ED visits and hospitalizations within the last year were reviewed.  Health maintenance:  Colonoscopy:  Completed 03/04/2017; polyps, 3 year follow up. Mammogram: completed: 11/19/2016, Birads 1. normal.  Cervical cancer screening: last pap: 02/2014 per pt, "always normal" Immunizations: tdap 2014 UTD, Influenza declined (encouraged yearly). Shingrix #1 09/16/2017- Scheduled #2 Oct. 3. Infectious disease screening: HIV completed 1998 DEXA: N/A Assistive device: none Oxygen TDV:VOHY Patient has a Dental home. Hospitalizations/ED visits: reviewed  Depression screen [facility] 2/9 10/23/2017 03/16/2017 10/08/2016 10/08/2016 06/25/2016  Decreased Interest 0 0 0 0 0  Down, Depressed, Hopeless 0 0 0 0 0  PHQ - 2 Score 0 0 0 0 0  Altered sleeping 0 0 0 - -  Tired, decreased energy 0 0 0 - -  Change in appetite 0 0 0 - -  Feeling bad or failure about yourself  0 0 0 - -  Trouble concentrating 0 0 0 - -  Moving slowly or fidgety/restless 0 0 0 - -  Suicidal thoughts 0 0 0 - -  PHQ-9 Score 0 0 0 - -  Difficult doing work/chores Not difficult at all - - - -   GAD 7 : Generalized Anxiety Score 09/14/2017 03/16/2017 05/28/2016  Nervous, Anxious, on Edge 0 0 1  Control/stop worrying 0 0 1  Worry too much - different things 0 0 1  Trouble relaxing 0 0 1  Restless 0 0 0  Easily annoyed or irritable 0 0 0  Afraid -  awful might happen 0 0 0  Total GAD 7 Score 0 0 4  Anxiety Difficulty Not difficult at all - Very difficult     Current Exercise Habits: Structured exercise class;Home exercise routine, Type of exercise: walking;strength training/weights, Time (Minutes): 30, Frequency (Times/Week): 7, Weekly Exercise (Minutes/Week): 210, Intensity: Moderate     Immunization History  Administered Date(s) Administered  . Tdap 10/26/2012  . Zoster Recombinat (Shingrix) 09/16/2017     Past Medical History:  Diagnosis Date  . Allergy   . Anemia   . Anxiety   . Depression   . Fatigue   . Frequent UTI   . Hyperlipidemia   . Migraine   . Reactive airway disease    patient unaware RAD  . Renal stones   . Rosacea   . Vertigo    was on antivert   Allergies  Allergen Reactions  . Codeine    Past Surgical History:  Procedure Laterality Date  . BREAST BIOPSY  1990   benign  . LASIK  2001  . TONSILLECTOMY AND ADENOIDECTOMY  1985  . WISDOM TOOTH EXTRACTION     Family History  Problem Relation Age of Onset  . Diabetes Mother   . Mental illness Mother        Hoarder  . Dementia Mother   . Asthma Mother   . Depression Mother   .  Migraines Mother   . Atrial fibrillation Father   . Hypertension Father   . Diabetes Maternal Aunt   . Hearing loss Maternal Aunt   . Stroke Maternal Grandmother   . Arthritis Maternal Grandfather   . Heart disease Maternal Grandfather   . Colon polyps Maternal Grandfather   . Colon polyps Paternal Grandfather   . Colon cancer Neg Hx   . Esophageal cancer Neg Hx   . Rectal cancer Neg Hx   . Stomach cancer Neg Hx    Social History   Socioeconomic History  . Marital status: Married    Spouse name: [name] Reading  . Number of children: 2  . Years of education: 53  . Highest education level: Not on file  Occupational History  . Occupation: [employer]  Social Needs  . Financial resource strain: Not on file  . Food insecurity:    Worry: Not on file    Inability:  Not on file  . Transportation needs:    Medical: Not on file    Non-medical: Not on file  Tobacco Use  . Smoking status: Never Smoker  . Smokeless tobacco: Never Used  Substance and Sexual Activity  . Alcohol use: No  . Drug use: No  . Sexual activity: Yes    Partners: Male    Birth control/protection: Surgical    Comment: Married; husband vasectomy  Lifestyle  . Physical activity:    Days per week: Not on file    Minutes per session: Not on file  . Stress: Not on file  Relationships  . Social connections:    Talks on phone: Not on file    Gets together: Not on file    Attends religious service: Not on file    Active member of club or organization: Not on file    Attends meetings of clubs or organizations: Not on file    Relationship status: Not on file  . Intimate partner violence:    Fear of current or ex partner: Not on file    Emotionally abused: Not on file    Physically abused: Not on file    Forced sexual activity: Not on file  Other Topics Concern  . Not on file  Social History Narrative   Married to [place]. Two adult children [name] and [name] (in college).    Moved from [place] [facility]  area[place] 09/2015.    SAHM. College educated.    Drinks one cup of coffee a day.   Takes a daily vitamin.   Wears seatbelts, bicycle helmet and smoke detector in the home.   Tries to exercise routinely.   Feels safe in her relationships.   Allergies as of 10/23/2017      Reactions   Codeine       Medication List        Accurate as of 10/23/17  9:01 AM. Always use your most recent med list.          Fish Oil 1000 MG Caps Take 1 capsule by mouth daily.   nitrofurantoin (macrocrystal-monohydrate) 100 MG capsule Commonly known as:  MACROBID Take 1 capsule (100 mg total) by mouth as needed. 1 capsule after intercourse to prevent UTI   PARoxetine 20 MG tablet Commonly known as:  PAXIL Take 1 tablet (20 mg total) by mouth daily.   PROBIOTIC ACIDOPHILUS PO Take 1 capsule  by mouth daily.       All past medical history, surgical history, allergies, family history, immunizations andmedications were updated in the  EMR today and reviewed under the history and medication portions of their EMR.     Recent Results (from the past 2160 hour(s))  POCT Urinalysis Dipstick (Automated)     Status: Normal   Collection Time: 09/16/17  2:15 PM  Result Value Ref Range   Color, UA yellow    Clarity, UA clear    Glucose, UA Negative Negative   Bilirubin, UA Negative    Ketones, UA Negative    Spec Grav, UA 1.025 1.010 - 1.025   Blood, UA Negative    pH, UA 6.0 5.0 - 8.0   Protein, UA Negative Negative   Urobilinogen, UA 0.2 0.2 or 1.0 E.U./dL   Nitrite, UA Negative    Leukocytes, UA Negative Negative  Urine Culture     Status: None   Collection Time: 09/16/17  2:52 PM  Result Value Ref Range   MICRO NUMBER: [id number]    SPECIMEN QUALITY: ADEQUATE    Sample Source NOT GIVEN    STATUS: FINAL    Result:      Multiple organisms present, each less than 10,000 CFU/mL. These organisms, commonly found on external and internal genitalia, are considered to be colonizers. No further testing performed.    Mm Screening Breast Tomo Bilateral  Result Date: 11/20/2016 CLINICAL DATA:  Screening. EXAM: 2D DIGITAL SCREENING BILATERAL MAMMOGRAM WITH CAD AND ADJUNCT TOMO COMPARISON:  Previous exam(s). ACR Breast Density Category d: The breast tissue is extremely dense, which lowers the sensitivity of mammography. FINDINGS: There are no findings suspicious for malignancy. Images were processed with CAD. IMPRESSION: No mammographic evidence of malignancy. A result letter of this screening mammogram will be mailed directly to the patient. RECOMMENDATION: Screening mammogram in one year. (Code:SM-B-01Y) BI-RADS CATEGORY  1: Negative. Electronically Signed   By: Ammie Ferrier M.D.   On: 11/20/2016 14:18   ROS: 14 pt review of systems performed and negative (unless mentioned in an  HPI)  Objective: BP 114/73 (BP Location: Right Arm, Patient Position: Sitting, Cuff Size: Normal)   Pulse 87   Temp 98.4 F (36.9 C)   Resp 20   Ht '5\' 6"'$  (1.676 m)   Wt 148 lb 4 oz (67.2 kg)   LMP 12/31/2014   SpO2 98%   BMI 23.93 kg/m  Gen: Afebrile. No acute distress. Nontoxic in appearance, well-developed, well-nourished,  Pleasant, caucasian female.  HENT: AT. [place]. Bilateral TM visualized and normal in appearance, normal external auditory canal. MMM, no oral lesions, adequate dentition. Bilateral nares within normal limits. Throat without erythema, ulcerations or exudates. no Cough on exam, no hoarseness on exam. Eyes:Pupils Equal Round Reactive to light, Extraocular movements intact,  Conjunctiva without redness, discharge or icterus. Neck/lymp/endocrine: Supple,no lymphadenopathy, no thyromegaly CV: RRR no murmur, no edema, +2/4 P posterior tibialis pulses. no carotid bruits. No JVD. Chest: CTAB, no wheeze, rhonchi or crackles. Normal  Respiratory effort. good Air movement. Abd: Soft. flat. NTND. BS present. no Masses palpated. No hepatosplenomegaly. No rebound tenderness or guarding. Skin:  o rashes, purpura or petechiae. Warm and well-perfused. Skin intact. Neuro/Msk:  Normal gait. PERLA. EOMi. Alert. Oriented x3.  Cranial nerves II through XII intact. Muscle strength 5/5 upper/lower extremity. Small cyst left foot. DTRs equal bilaterally. Psych: Normal affect, dress and demeanor. Normal speech. Normal thought content and judgment.  No exam data present  Assessment/plan: Carla Beltran is a 52 y.o. female present for CPE. Hyperlipidemia, unspecified hyperlipidemia type - Lipid panel - TSH Generalized anxiety disorder - stable. Continue paxil 20 -  CBC w/Diff - Comp Met (CMET) - TSH Screening for diabetes mellitus - HgB A1c Polyp of colon, unspecified part of colon, unspecified type F/U 3 months.  Breast cancer screening - MM DIGITAL SCREENING BILATERAL; Future Encounter  for preventive health examination Patient was encouraged to exercise greater than 150 minutes a week. Patient was encouraged to choose a diet filled with fresh fruits and vegetables, and lean meats. AVS provided to patient today for education/recommendation on gender specific health and safety maintenance. Colonoscopy:  Completed 03/04/2017; polyps, 3 year follow up. Mammogram: completed: 11/19/2016, Birads 1. normal. Ordered today.  Cervical cancer screening: last pap: 06/2014 per pt, "always normal"--> will complete at Jan 2020 appt.  Immunizations: tdap 2014 UTD, Influenza declined (encouraged yearly). Shingrix #1 09/16/2017- Scheduled #2 Oct. 3. Infectious disease screening: HIV completed 1998 DEXA: N/A   Return in about 1 year (around 10/24/2018) for CPE.    Electronically signed by: Howard Pouch, DO [place]

## 2017-10-23 NOTE — Telephone Encounter (Signed)
Please inform patient the following information: Her labs are normal with the following exceptions---> her cholesterol is entirely too high with a total cholesterol of 300 and the bad cholesterol (LDL) 206. She is extremely fearful of statins. However this level of cholesterol is putting her at a higher stroke risk.   - I have called in med to her mail in pharmacy called [facility]. It is NOT a statin.  We will try this med first, but if she is unable to get cholesterol down with this med, diet and exercise the next step will be a statin and/or cardiology referral. [facility] does not provide the extra CV protection statins provided. It will only help her lower the cholesterol level.  Diet low in cupcake (she loves) and saturated fats, higher fiber.  Exercise > 150 minutes a week.  Start med--> IF she starts med, we will repeat lipids at her January appt

## 2017-10-26 NOTE — Telephone Encounter (Signed)
Spoke with patient reviewed lab results and instructions. Patient verbalized understanding. Patient refuses to start medication at this time she states her previous PCP told her that she could control it with diet so she will try that she states she will keep a log of what she eats and schedule an appt with Dr Raoul Pitch to review diet.

## 2017-11-11 ENCOUNTER — Ambulatory Visit: Payer: 59

## 2017-11-19 ENCOUNTER — Ambulatory Visit: Payer: 59

## 2017-11-20 ENCOUNTER — Ambulatory Visit
Admission: RE | Admit: 2017-11-20 | Discharge: 2017-11-20 | Disposition: A | Discharge status: Home / Self Care | Payer: 59 | Source: Ambulatory Visit | Attending: Family Medicine | Admitting: Family Medicine

## 2017-11-20 DIAGNOSIS — Z1231 Encounter for screening mammogram for malignant neoplasm of breast: Secondary | ICD-10-CM

## 2017-11-23 ENCOUNTER — Ambulatory Visit: Payer: 59

## 2017-11-26 ENCOUNTER — Ambulatory Visit (INDEPENDENT_AMBULATORY_CARE_PROVIDER_SITE_OTHER): Payer: 59

## 2017-11-26 DIAGNOSIS — Z23 Encounter for immunization: Secondary | ICD-10-CM

## 2017-11-26 NOTE — Progress Notes (Addendum)
Patient presents today for Shingrix #2. Given with no complications and patient tolerated well.   Medical screening examination/treatment/procedure(s) were performed by non-physician practitioner and as supervising physician I was immediately available for consultation/collaboration.  I agree with above assessment and plan.  Electronically Signed by: Howard Pouch, DO [place] [facility] [place]

## 2017-12-02 DIAGNOSIS — D23112 Other benign neoplasm of skin of right lower eyelid, including canthus: Secondary | ICD-10-CM | POA: Diagnosis not present

## 2017-12-02 DIAGNOSIS — H11823 Conjunctivochalasis, bilateral: Secondary | ICD-10-CM | POA: Diagnosis not present

## 2017-12-02 DIAGNOSIS — H2513 Age-related nuclear cataract, bilateral: Secondary | ICD-10-CM | POA: Diagnosis not present

## 2017-12-27 ENCOUNTER — Other Ambulatory Visit: Payer: Self-pay | Admitting: Family Medicine

## 2017-12-29 ENCOUNTER — Other Ambulatory Visit: Payer: Self-pay | Admitting: *Deleted

## 2017-12-29 MED ORDER — NITROFURANTOIN MONOHYD MACRO 100 MG PO CAPS: ORAL_CAPSULE | ORAL | 1 refills | Status: DC

## 2018-01-01 ENCOUNTER — Telehealth: Payer: Self-pay | Admitting: *Deleted

## 2018-01-01 NOTE — Telephone Encounter (Signed)
Copied from [place] [id number]. Topic: Quick Communication - Rx Refill/Question >> Dec 31, 2017  2:11 PM Scherrie Gerlach wrote: Medication: nitrofurantoin, macrocrystal-monohydrate, (MACROBID) 100 MG capsule  [facility] calling to clarify if the dr really ment to prescribe this med, as this is not generally prescribed for long term use. Pharmacy would like a call back toclarify [phone] Press option 1 then option 2 for pharmacy Ref [id number] >> Jan 01, 2018 10:14 AM Scherrie Gerlach wrote: [facility] states this is final attempt and they are cancelling the Rx.

## 2018-01-01 NOTE — Telephone Encounter (Signed)
Spoke with pharmacist confirmed Rx is accurate fill as order per Dr Raoul Pitch.

## 2018-03-04 ENCOUNTER — Other Ambulatory Visit: Payer: Self-pay | Admitting: Family Medicine

## 2018-03-04 DIAGNOSIS — F411 Generalized anxiety disorder: Secondary | ICD-10-CM

## 2018-03-04 DIAGNOSIS — F410 Panic disorder [episodic paroxysmal anxiety] without agoraphobia: Secondary | ICD-10-CM

## 2018-03-16 ENCOUNTER — Encounter: Payer: Self-pay | Admitting: Family Medicine

## 2018-03-16 ENCOUNTER — Ambulatory Visit (INDEPENDENT_AMBULATORY_CARE_PROVIDER_SITE_OTHER): Payer: 59 | Admitting: Family Medicine

## 2018-03-16 VITALS — BP 116/71 | HR 94 | Temp 98.6°F | Resp 16 | Ht 65.0 in | Wt 156.2 lb

## 2018-03-16 DIAGNOSIS — E785 Hyperlipidemia, unspecified: Secondary | ICD-10-CM | POA: Diagnosis not present

## 2018-03-16 DIAGNOSIS — F411 Generalized anxiety disorder: Secondary | ICD-10-CM

## 2018-03-16 DIAGNOSIS — E663 Overweight: Secondary | ICD-10-CM

## 2018-03-16 DIAGNOSIS — F410 Panic disorder [episodic paroxysmal anxiety] without agoraphobia: Secondary | ICD-10-CM | POA: Diagnosis not present

## 2018-03-16 DIAGNOSIS — Z5320 Procedure and treatment not carried out because of patient's decision for unspecified reasons: Secondary | ICD-10-CM

## 2018-03-16 MED ORDER — PAROXETINE HCL 20 MG PO TABS: 20.0000 mg | ORAL_TABLET | Freq: Every day | ORAL | 1 refills | Status: DC

## 2018-03-16 NOTE — Progress Notes (Signed)
Patient ID: Carla Beltran, female  DOB: 1965-10-31, 53 y.o.   MRN: [id number] Patient Care Team    Relationship Specialty Notifications Start End  [name], DO PCP - General Family Medicine  05/28/16   Doran Stabler, MD Consulting Physician Gastroenterology  03/13/17    Chief Complaint  Patient presents with  . Follow-up    No concerns. Not fasting     Subjective:  Carla Beltran is a 53 y.o.  female present for follow up on  Anxiety/phobia:  She reports she is doing very well on her XL 20 mg daily.  She is starting to plan her daughter's wedding.  Waiting will be in December 2020.  She reports mild symptoms of her fatigue which seem to come on during the winter months.  She has not yet started the light therapy.  Prior note: Currently doing well on Paxil 20 mg Qd. She is wondering if wintertime she does not suffer from some seasonal affective disorder on top of her depression and anxiety. Original Prior note:  Patient presents for establishment today and admits to increased anxiety and panic attacks. She has fears that she has trouble controlling. She worries about everything. She has a strong fear of driving on the [place], especially in areas she is unfamiliar with. She endorses panic attacks that occur during driving and at other times. She states she experiences a increased heart rate and then feeling like she is "out of her body ". She reports increased anxiety in her life especially around times when she is moving, such as currently. She states she's always had anxiety. She reports she did not have a great childhood. Her mother now has dementia and is in [place] in a nursing facility, this also caused her increased anxiety. Her mother with hoarder. She has 2 daughters, which also are affected by anxiety and are on Celexa. She states her daughters are not affected by his much panic she is. She reports being on Zoloft since 2006. She was initially on 25 mg of  Zoloft daily, and eventually increased up to Zoloft 50 mg daily. Prior to moving in August 2017 her prior provider told her to increase to 75 mg daily. She states that she did not like the apathetic side effects she experienced at 75 mg dose, so she decreased back down to the 50 mg dose daily. She reports being seen by counselor many times in the past. She has seek the help of a Marketing executive in [place] Leanor Rubenstein). She has yet to establish with her.  Hyperlipidemia: He has rather significantly elevated lipids.  Discussed this with her today again.  She has refused statins.  She has been counseled on the benefits of statins.  She has a family history of stroke.  She states she started to take red yeast rice and fish oil.  Mood disorder screening: negative.   Depression screen [facility] 2/9 03/16/2018 10/23/2017 03/16/2017 10/08/2016 10/08/2016  Decreased Interest 0 0 0 0 0  Down, Depressed, Hopeless 0 0 0 0 0  PHQ - 2 Score 0 0 0 0 0  Altered sleeping 0 0 0 0 -  Tired, decreased energy 0 0 0 0 -  Change in appetite 0 0 0 0 -  Feeling bad or failure about yourself  0 0 0 0 -  Trouble concentrating 0 0 0 0 -  Moving slowly or fidgety/restless 0 0 0 0 -  Suicidal thoughts 0 0  0 0 -  PHQ-9 Score 0 0 0 0 -  Difficult doing work/chores Not difficult at all Not difficult at all - - -   GAD 7 : Generalized Anxiety Score 03/16/2018 09/14/2017 03/16/2017 05/28/2016  Nervous, Anxious, on Edge 0 0 0 1  Control/stop worrying 0 0 0 1  Worry too much - different things 0 0 0 1  Trouble relaxing 0 0 0 1  Restless 0 0 0 0  Easily annoyed or irritable 0 0 0 0  Afraid - awful might happen 0 0 0 0  Total GAD 7 Score 0 0 0 4  Anxiety Difficulty Not difficult at all Not difficult at all - Very difficult       Immunization History  Administered Date(s) Administered  . Tdap 10/26/2012  . Zoster Recombinat (Shingrix) 09/16/2017, 11/26/2017   Past Medical History:  Diagnosis Date  . Allergy   . Anemia    . Anxiety   . Depression   . Fatigue   . Frequent UTI   . Hyperlipidemia   . Migraine   . Reactive airway disease    patient unaware RAD  . Renal stones   . Rosacea   . Vertigo    was on antivert   Allergies  Allergen Reactions  . Codeine    Past Surgical History:  Procedure Laterality Date  . BREAST BIOPSY  1990   benign  . LASIK  2001  . TONSILLECTOMY AND ADENOIDECTOMY  1985  . WISDOM TOOTH EXTRACTION     Family History  Problem Relation Age of Onset  . Diabetes Mother   . Mental illness Mother        Hoarder  . Dementia Mother   . Asthma Mother   . Depression Mother   . Migraines Mother   . Atrial fibrillation Father   . Hypertension Father   . Diabetes Maternal Aunt   . Hearing loss Maternal Aunt   . Stroke Maternal Grandmother   . Arthritis Maternal Grandfather   . Heart disease Maternal Grandfather   . Colon polyps Maternal Grandfather   . Colon polyps Paternal Grandfather   . Colon cancer Neg Hx   . Esophageal cancer Neg Hx   . Rectal cancer Neg Hx   . Stomach cancer Neg Hx    Social History   Socioeconomic History  . Marital status: Married    Spouse name: [name] Reading  . Number of children: 2  . Years of education: 66  . Highest education level: Not on file  Occupational History  . Occupation: [employer]  Social Needs  . Financial resource strain: Not on file  . Food insecurity:    Worry: Not on file    Inability: Not on file  . Transportation needs:    Medical: Not on file    Non-medical: Not on file  Tobacco Use  . Smoking status: Never Smoker  . Smokeless tobacco: Never Used  Substance and Sexual Activity  . Alcohol use: No  . Drug use: No  . Sexual activity: Yes    Partners: Male    Birth control/protection: Surgical    Comment: Married; husband vasectomy  Lifestyle  . Physical activity:    Days per week: Not on file    Minutes per session: Not on file  . Stress: Not on file  Relationships  . Social connections:    Talks on phone:  Not on file    Gets together: Not on file    Attends religious service:  Not on file    Active member of club or organization: Not on file    Attends meetings of clubs or organizations: Not on file    Relationship status: Not on file  . Intimate partner violence:    Fear of current or ex partner: Not on file    Emotionally abused: Not on file    Physically abused: Not on file    Forced sexual activity: Not on file  Other Topics Concern  . Not on file  Social History Narrative   Married [place]. Two adult children [name] and [name] (in college).    [place] [facility]  area[place] 09/2015.    SAHM. College educated.    Drinks one cup of coffee a day.   Takes a daily vitamin.   Wears seatbelts, bicycle helmet and smoke detector in the home.   Tries to exercise routinely.   Feels safe in her relationships.   Allergies as of 03/16/2018      Reactions   Codeine       Medication List       Accurate as of March 16, 2018  1:09 PM. Always use your most recent med list.        ezetimibe 10 MG tablet Commonly known as:  ZETIA Take 1 tablet (10 mg total) by mouth daily.   Fish Oil 1000 MG Caps Take 2 capsules by mouth daily. 2150mg  daily   nitrofurantoin (macrocrystal-monohydrate) 100 MG capsule Commonly known as:  MACROBID TAKE 1 CAPSULE BY MOUTH AS  NEEDED AFTER INTERCOURSE TO PREVENT UTI   PARoxetine 20 MG tablet Commonly known as:  PAXIL Take 1 tablet (20 mg total) by mouth daily.   PROBIOTIC ACIDOPHILUS PO Take 1 capsule by mouth daily.   Red Yeast Rice 600 MG Caps Take 2 capsules by mouth daily.        No results found for this or any previous visit (from the past 2160 hour(s)).  Patient was never admitted.  ROS: 14 pt review of systems performed and negative (unless mentioned in an HPI)  Objective: BP 116/71 (BP Location: Left Arm, Patient Position: Sitting, Cuff Size: Normal)   Pulse 94   Temp 98.6 F (37 C) (Oral)   Resp 16   Ht 5\' 5"  (1.651 m)    Wt 156 lb 4 oz (70.9 kg)   LMP 12/31/2014   SpO2 96%   BMI 26.00 kg/m   Gen: Afebrile. No acute distress.  HENT: AT. [place]. MMM.  Eyes:Pupils Equal Round Reactive to light, Extraocular movements intact,  Conjunctiva without redness, discharge or icterus. CV: RRR  Chest: CTAB, no wheeze or crackles Neuro:  Normal gait. PERLA. EOMi. Alert. Oriented.  Psych: Normal affect, dress and demeanor. Normal speech. Normal thought content and judgment.   Assessment/plan: JALEESA CERVI is a 53 y.o. female present for establish care Generalized anxiety disorder/panic attacks -Stable today.  Refills on paxil 20 g QD provided.  -Again, encouraged  To try a light box therapy in the winter.  - PARoxetine (PAXIL) 20 MG tablet; Take 1 tablet (20 mg total) by mouth daily.  Dispense: 90 tablet; Refill: 1 - continue Marketing executive.. - F/U every 6 months, unless needed sooner (September ok- if needs refills to get her to CPE - ok to fill)  Hyperlipidemia/statin declined: - LDL greater than 200.  Patient strongly encouraged to reconsider statin use.  She still declines.  She is taking red yeast rice and fish oil. Next visit in 6  months will be CPE and labs will be completed at that time.  No follow-ups on file.  > 25 minutes spent with patient, >50% of time spent face to face counseling   Electronically signed by: Howard Pouch, [place]

## 2018-03-16 NOTE — Patient Instructions (Addendum)
It was nice to see you today.  I have refilled your meds . See after September 7 for CPE- PAP   Please help [place] help you:  We are honored you have chosen [facility] for your Primary Care home. Below you will find basic instructions that you may need to access in the future. Please help [place] help you by reading the instructions, which cover many of the frequent questions we experience.   Prescription refills and request:  -In order to allow more efficient response time, please call your pharmacy for all refills. They will forward the request electronically to [place]. This allows for the quickest possible response. Request left on a nurse line can take longer to refill, since these are checked as time allows between office patients and other phone calls.  - refill request can take up to 3-5 working days to complete.  - If request is sent electronically and request is appropiate, it is usually completed in 1-2 business days.  - all patients will need to be seen routinely for all chronic medical conditions requiring prescription medications (see follow-up below). If you are overdue for follow up on your condition, you will be asked to make an appointment and we will call in enough medication to cover you until your appointment (up to 30 days).  - all controlled substances will require a face to face visit to request/refill.  - if you desire your prescriptions to go through a new pharmacy, and have an active script at original pharmacy, you will need to call your pharmacy and have scripts transferred to new pharmacy. This is completed between the pharmacy locations and not by your provider.    Results: If any images or labs were ordered, it can take up to 1 week to get results depending on the test ordered and the lab/facility running and resulting the test. - Normal or stable results, which do not need further discussion, may be released to your mychart immediately with attached note to you. A call may  not be generated for normal results. Please make certain to sign up for mychart. If you have questions on how to activate your mychart you can call the front office.  - If your results need further discussion, our office will attempt to contact you via phone, and if unable to reach you after 2 attempts, we will release your abnormal result to your mychart with instructions.  - All results will be automatically released in mychart after 1 week.  - Your provider will provide you with explanation and instruction on all relevant material in your results. Please keep in mind, results and labs may appear confusing or abnormal to the untrained eye, but it does not mean they are actually abnormal for you personally. If you have any questions about your results that are not covered, or you desire more detailed explanation than what was provided, you should make an appointment with your provider to do so.   Our office handles many outgoing and incoming calls daily. If we have not contacted you within 1 week about your results, please check your mychart to see if there is a message first and if not, then contact our office.  In helping with this matter, you help decrease call volume, and therefore allow [place] to be able to respond to patients needs more efficiently.   Acute office visits (sick visit):  An acute visit is intended for a new problem and are scheduled in shorter time slots to allow  schedule openings for patients with new problems. This is the appropriate visit to discuss a new problem. Problems will not be addressed by phone call or Echart message. Appointment is needed if requesting treatment. In order to provide you with excellent quality medical care with proper time for you to explain your problem, have an exam and receive treatment with instructions, these appointments should be limited to one new problem per visit. If you experience a new problem, in which you desire to be addressed, please make an  acute office visit, we save openings on the schedule to accommodate you. Please do not save your new problem for any other type of visit, let us take care of it properly and quickly for you.   Follow up visits:  Depending on your condition(s) your provider will need to see you routinely in order to provide you with quality care and prescribe medication(s). Most chronic conditions (Example: hypertension, Diabetes, depression/anxiety... etc), require visits a couple times a year. Your provider will instruct you on proper follow up for your personal medical conditions and history. Please make certain to make follow up appointments for your condition as instructed. Failing to do so could result in lapse in your medication treatment/refills. If you request a refill, and are overdue to be seen on a condition, we will always provide you with a 30 day script (once) to allow you time to schedule.    Medicare wellness (well visit): - we have a wonderful Nurse Maudie Mercury), that will meet with you and provide you will yearly medicare wellness visits. These visits should occur yearly (can not be scheduled less than 1 calendar year apart) and cover preventive health, immunizations, advance directives and screenings you are entitled to yearly through your medicare benefits. Do not miss out on your entitled benefits, this is when medicare will pay for these benefits to be ordered for you.  These are strongly encouraged by your provider and is the appropriate type of visit to make certain you are up to date with all preventive health benefits. If you have not had your medicare wellness exam in the last 12 months, please make certain to schedule one by calling the office and schedule your medicare wellness with Maudie Mercury as soon as possible.   Yearly physical (well visit):  - Adults are recommended to be seen yearly for physicals. Check with your insurance and date of your last physical, most insurances require one calendar year  between physicals. Physicals include all preventive health topics, screenings, medical exam and labs that are appropriate for gender/age and history. You may have fasting labs needed at this visit. This is a well visit (not a sick visit), new problems should not be covered during this visit (see acute visit).  - Pediatric patients are seen more frequently when they are younger. Your provider will advise you on well child visit timing that is appropriate for your their age. - This is not a medicare wellness visit. Medicare wellness exams do not have an exam portion to the visit. Some medicare companies allow for a physical, some do not allow a yearly physical. If your medicare allows a yearly physical you can schedule the medicare wellness with our nurse Maudie Mercury and have your physical with your provider after, on the same day. Please check with insurance for your full benefits.   Late Policy/No Shows:  - all new patients should arrive 15-30 minutes earlier than appointment to allow [place] time  to  obtain all personal demographics,  insurance  information and for you to complete office paperwork. - All established patients should arrive 10-15 minutes earlier than appointment time to update all information and be checked in .  - In our best efforts to run on time, if you are late for your appointment you will be asked to either reschedule or if able, we will work you back into the schedule. There will be a wait time to work you back in the schedule,  depending on availability.  - If you are unable to make it to your appointment as scheduled, please call 24 hours ahead of time to allow [place] to fill the time slot with someone else who needs to be seen. If you do not cancel your appointment ahead of time, you may be charged a no show fee.

## 2018-03-17 ENCOUNTER — Encounter: Payer: Self-pay | Admitting: Family Medicine

## 2018-03-17 DIAGNOSIS — Z5320 Procedure and treatment not carried out because of patient's decision for unspecified reasons: Secondary | ICD-10-CM | POA: Insufficient documentation

## 2018-08-13 ENCOUNTER — Other Ambulatory Visit: Payer: Self-pay | Admitting: Family Medicine

## 2018-08-13 DIAGNOSIS — F410 Panic disorder [episodic paroxysmal anxiety] without agoraphobia: Secondary | ICD-10-CM

## 2018-08-13 DIAGNOSIS — F411 Generalized anxiety disorder: Secondary | ICD-10-CM

## 2018-08-13 NOTE — Telephone Encounter (Signed)
Pt is okay on refills and appt was made at the end of July

## 2018-08-13 NOTE — Telephone Encounter (Signed)
Please call patient. I have received refill request and pt is due for 6 mos follow up next month.  - please schedule her follow up so she does not run out of meds. Last seen 03/16/2018. Virtual is ok is she desires.

## 2018-09-15 ENCOUNTER — Other Ambulatory Visit: Payer: Self-pay

## 2018-09-15 ENCOUNTER — Encounter: Payer: Self-pay | Admitting: Family Medicine

## 2018-09-15 ENCOUNTER — Ambulatory Visit (INDEPENDENT_AMBULATORY_CARE_PROVIDER_SITE_OTHER): Payer: 59 | Admitting: Family Medicine

## 2018-09-15 DIAGNOSIS — F411 Generalized anxiety disorder: Secondary | ICD-10-CM

## 2018-09-15 DIAGNOSIS — F410 Panic disorder [episodic paroxysmal anxiety] without agoraphobia: Secondary | ICD-10-CM | POA: Diagnosis not present

## 2018-09-15 MED ORDER — PAROXETINE HCL 20 MG PO TABS: 20.0000 mg | ORAL_TABLET | Freq: Every day | ORAL | 1 refills | Status: DC

## 2018-09-15 MED ORDER — NITROFURANTOIN MONOHYD MACRO 100 MG PO CAPS: ORAL_CAPSULE | ORAL | 1 refills | Status: DC

## 2018-09-15 NOTE — Progress Notes (Signed)
VIRTUAL VISIT VIA VIDEO  I connected with [name] on 09/15/18 at  1:30 PM EDT by a video enabled telemedicine application and verified that I am speaking with the correct person using two identifiers. Location patient: Home Location provider: [facility], Office Persons participating in the virtual visit: Patient, Dr. Raoul Pitch and R.Baker, LPN  I discussed the limitations of evaluation and management by telemedicine and the availability of in person appointments. The patient expressed understanding and agreed to proceed.   Patient ID: Carla Beltran, female  DOB: 06/11/1965, 53 y.o.   MRN: [id number] Patient Care Team    Relationship Specialty Notifications Start End  [name], DO PCP - General Family Medicine  05/28/16   Doran Stabler, MD Consulting Physician Gastroenterology  03/13/17    Chief Complaint  Patient presents with  . Anxiety    No complaints. Need refills on macrobid and paxil     Subjective:  Carla Beltran is a 53 y.o.  female present for follow up on  Anxiety/phobia:  She reports she is doing very well on her Paxil 20 mg daily.  She is not experiencing her fatigue currently, that symptom usually comes in the winter months.  She is doing really well despite the pandemic.  She states that her husband is working from home and she is enjoying having him home.  They get up and walk together every morning.  She has found a lot of art projects to keep her busy. Prior note: Currently doing well on Paxil 20 mg Qd. She is wondering if wintertime she does not suffer from some seasonal affective disorder on top of her depression and anxiety. Original Prior note:  Patient presents for establishment today and admits to increased anxiety and panic attacks. She has fears that she has trouble controlling. She worries about everything. She has a strong fear of driving on the [place], especially in areas she is unfamiliar with. She endorses panic attacks that  occur during driving and at other times. She states she experiences a increased heart rate and then feeling like she is "out of her body ". She reports increased anxiety in her life especially around times when she is moving, such as currently. She states she's always had anxiety. She reports she did not have a great childhood. Her mother now has dementia and is in [place] in a nursing facility, this also caused her increased anxiety. Her mother with hoarder. She has 2 daughters, which also are affected by anxiety and are on Celexa. She states her daughters are not affected by his much panic she is. She reports being on Zoloft since 2006. She was initially on 25 mg of Zoloft daily, and eventually increased up to Zoloft 50 mg daily. Prior to moving in August 2017 her prior provider told her to increase to 75 mg daily. She states that she did not like the apathetic side effects she experienced at 75 mg dose, so she decreased back down to the 50 mg dose daily. She reports being seen by counselor many times in the past. She has seek the help of a Marketing executive in [place] Leanor Rubenstein). She has yet to establish with her.  Hyperlipidemia: He has rather significantly elevated lipids.  She has refused statins.  She has been counseled on the benefits of statins.  She has a family history of stroke.  She states she started to take red yeast rice and fish oil.  Mood disorder screening: negative.   Depression screen [facility] 2/9 09/15/2018 03/16/2018 10/23/2017 03/16/2017 10/08/2016  Decreased Interest 0 0 0 0 0  Down, Depressed, Hopeless 0 0 0 0 0  PHQ - 2 Score 0 0 0 0 0  Altered sleeping - 0 0 0 0  Tired, decreased energy - 0 0 0 0  Change in appetite - 0 0 0 0  Feeling bad or failure about yourself  - 0 0 0 0  Trouble concentrating - 0 0 0 0  Moving slowly or fidgety/restless - 0 0 0 0  Suicidal thoughts - 0 0 0 0  PHQ-9 Score - 0 0 0 0  Difficult doing work/chores - Not difficult at all Not  difficult at all - -   GAD 7 : Generalized Anxiety Score 09/15/2018 03/16/2018 09/14/2017 03/16/2017  Nervous, Anxious, on Edge 0 0 0 0  Control/stop worrying 0 0 0 0  Worry too much - different things 0 0 0 0  Trouble relaxing 0 0 0 0  Restless 0 0 0 0  Easily annoyed or irritable 0 0 0 0  Afraid - awful might happen 0 0 0 0  Total GAD 7 Score 0 0 0 0  Anxiety Difficulty Not difficult at all Not difficult at all Not difficult at all -       Immunization History  Administered Date(s) Administered  . Tdap 10/26/2012  . Zoster Recombinat (Shingrix) 09/16/2017, 11/26/2017   Past Medical History:  Diagnosis Date  . Allergy   . Anemia   . Anxiety   . Depression   . Fatigue   . Frequent UTI   . Hyperlipidemia   . Migraine   . Reactive airway disease    patient unaware RAD  . Renal stones   . Rosacea   . Vertigo    was on antivert   Allergies  Allergen Reactions  . Codeine    Past Surgical History:  Procedure Laterality Date  . BREAST BIOPSY  1990   benign  . LASIK  2001  . TONSILLECTOMY AND ADENOIDECTOMY  1985  . WISDOM TOOTH EXTRACTION     Family History  Problem Relation Age of Onset  . Diabetes Mother   . Mental illness Mother        Hoarder  . Dementia Mother   . Asthma Mother   . Depression Mother   . Migraines Mother   . Atrial fibrillation Father   . Hypertension Father   . Diabetes Maternal Aunt   . Hearing loss Maternal Aunt   . Stroke Maternal Grandmother   . Arthritis Maternal Grandfather   . Heart disease Maternal Grandfather   . Colon polyps Maternal Grandfather   . Colon polyps Paternal Grandfather   . Colon cancer Neg Hx   . Esophageal cancer Neg Hx   . Rectal cancer Neg Hx   . Stomach cancer Neg Hx    Social History   Socioeconomic History  . Marital status: Married    Spouse name: [name] Reading  . Number of children: 2  . Years of education: 49  . Highest education level: Not on file  Occupational History  . Occupation: [employer]  Social  Needs  . Financial resource strain: Not on file  . Food insecurity    Worry: Not on file    Inability: Not on file  . Transportation needs    Medical: Not on file    Non-medical: Not on file  Tobacco Use  . Smoking  status: Never Smoker  . Smokeless tobacco: Never Used  Substance and Sexual Activity  . Alcohol use: No  . Drug use: No  . Sexual activity: Yes    Partners: Male    Birth control/protection: Surgical    Comment: Married; husband vasectomy  Lifestyle  . Physical activity    Days per week: Not on file    Minutes per session: Not on file  . Stress: Not on file  Relationships  . Social Herbalist on phone: Not on file    Gets together: Not on file    Attends religious service: Not on file    Active member of club or organization: Not on file    Attends meetings of clubs or organizations: Not on file    Relationship status: Not on file  . Intimate partner violence    Fear of current or ex partner: Not on file    Emotionally abused: Not on file    Physically abused: Not on file    Forced sexual activity: Not on file  Other Topics Concern  . Not on file  Social History Narrative   Married to [place]. Two adult children [name] and [name] (in college).    Moved from [facility]  area) 09/2015.    SAHM. College educated.    Drinks one cup of coffee a day.   Takes a daily vitamin.   Wears seatbelts, bicycle helmet and smoke detector in the home.   Tries to exercise routinely.   Feels safe in her relationships.   Allergies as of 09/15/2018      Reactions   Codeine       Medication List       Accurate as of September 15, 2018  1:29 PM. If you have any questions, ask your nurse or doctor.        Fish Oil 1000 MG Caps Take 2 capsules by mouth daily. 2150mg  daily   nitrofurantoin (macrocrystal-monohydrate) 100 MG capsule Commonly known as: MACROBID TAKE 1 CAPSULE BY MOUTH AS  NEEDED AFTER INTERCOURSE TO PREVENT UTI   PARoxetine 20 MG tablet  Commonly known as: PAXIL Take 1 tablet (20 mg total) by mouth daily.   PROBIOTIC ACIDOPHILUS PO Take 1 capsule by mouth daily.   Red Yeast Rice 600 MG Caps Take 2 capsules by mouth daily.        No results found for this or any previous visit (from the past 2160 hour(s)).  Patient was never admitted.  ROS: 14 pt review of systems performed and negative (unless mentioned in an HPI)  Objective: Ht 5\' 5"  (1.651 m)   LMP 12/31/2014   BMI 26.00 kg/m   Gen: Afebrile. No acute distress.  HENT: AT. [place]. MMM.  Neuro:  Normal gait.  Oriented x3  Psych: Normal affect, dress and demeanor. Normal speech. Normal thought content and judgment.  Assessment/plan: AHNA KONKLE is a 53 y.o. female present for establish care Generalized anxiety disorder/panic attacks -Stable today.  Refills on paxil 20 g QD provided.  -Again, encouraged  To try a light box therapy in the winter.  - PARoxetine (PAXIL) 20 MG tablet; Take 1 tablet (20 mg total) by mouth daily.  Dispense: 90 tablet; Refill: 1 - continue Marketing executive.. - F/U every 6 months, unless needed sooner   Hyperlipidemia/statin declined: - LDL greater than 200.  Patient strongly encouraged to reconsider statin use- declines.  She is taking red yeast rice and fish oil. -Follow yearly Physical  which is coming up in September.   > 15 minutes spent with patient, > 50% of that time face to face   Electronically signed by: Howard Pouch, [place]

## 2018-10-26 ENCOUNTER — Telehealth: Payer: Self-pay | Admitting: Family Medicine

## 2018-10-26 NOTE — Telephone Encounter (Signed)
Pt was called and told these would be ordered if needed at her CPE that is scheduled on 12/22/2018.

## 2018-10-26 NOTE — Telephone Encounter (Signed)
Patient has not had a bone density test. Please put in an order if patient is due. She would like to do it at [facility].

## 2018-10-28 ENCOUNTER — Encounter: Payer: 59 | Admitting: Family Medicine

## 2018-11-17 ENCOUNTER — Telehealth: Payer: Self-pay | Admitting: Family Medicine

## 2018-11-17 NOTE — Telephone Encounter (Signed)
Patient would like to see Dr. Raoul Pitch. Routing message to provider to see if patient can be worked in.

## 2018-11-17 NOTE — Telephone Encounter (Signed)
Patient is asking advise about whether she should take a maintaince antibiotic for what she thinks is vaginitis. She has no pressure or pain while she urinates but has been noticing an odd smell. Patient okay with making an appointment.

## 2018-11-17 NOTE — Telephone Encounter (Signed)
Please schedule patient.

## 2018-11-18 NOTE — Telephone Encounter (Signed)
There are openings on Friday.

## 2018-11-18 NOTE — Telephone Encounter (Signed)
Please advise, thank you.

## 2018-12-01 ENCOUNTER — Ambulatory Visit (INDEPENDENT_AMBULATORY_CARE_PROVIDER_SITE_OTHER): Payer: 59 | Admitting: Family Medicine

## 2018-12-01 ENCOUNTER — Other Ambulatory Visit: Payer: Self-pay

## 2018-12-01 ENCOUNTER — Encounter: Payer: Self-pay | Admitting: Family Medicine

## 2018-12-01 VITALS — BP 124/79 | HR 82 | Temp 97.7°F | Resp 17 | Ht 65.0 in | Wt 151.0 lb

## 2018-12-01 DIAGNOSIS — R350 Frequency of micturition: Secondary | ICD-10-CM | POA: Diagnosis not present

## 2018-12-01 DIAGNOSIS — R109 Unspecified abdominal pain: Secondary | ICD-10-CM | POA: Diagnosis not present

## 2018-12-01 LAB — POCT URINALYSIS DIPSTICK
Bilirubin, UA: NEGATIVE
Blood, UA: 10
Glucose, UA: NEGATIVE
Ketones, UA: NEGATIVE
Protein, UA: NEGATIVE
Spec Grav, UA: >=1.03 — AB (ref 1.010–1.025)
Urobilinogen, UA: 0.2 E.U./dL
pH, UA: 6 (ref 5.0–8.0)

## 2018-12-01 NOTE — Progress Notes (Signed)
Carla Beltran , 12-Mar-1965, 53 y.o., female MRN: [id number] Patient Care Team    Relationship Specialty Notifications Start End  [name], DO PCP - General Family Medicine  05/28/16   Doran Stabler, MD Consulting Physician Gastroenterology  03/13/17     Chief Complaint  Patient presents with  . Urinary Tract Infection    Odor and pressure with some sensitivity. Pt took Macrobid x7 days. Last day of macrobid was 11/25/2018 and symptoms started back yesterday. No vaginal discharge.       Subjective: Pt presents for an OV with complaints of urinary frequency of 2 weeks duration.  Associated symptoms include bladder pressure. She has had frequent UTI in the past. She is prescribed Macrobid prophylaxis (post coital) and does not routinely use. She denies  nausea, vomit, abd pain, URI sx or low back pain.  Pt has tried hydrating and macrobid x 7 days BID to ease their symptoms. She states she thinks she forgot the last day. Last dose was 10/8>> symptoms returned yesterday.   Depression screen [facility] 2/9 09/15/2018 03/16/2018 10/23/2017 03/16/2017 10/08/2016  Decreased Interest 0 0 0 0 0  Down, Depressed, Hopeless 0 0 0 0 0  PHQ - 2 Score 0 0 0 0 0  Altered sleeping - 0 0 0 0  Tired, decreased energy - 0 0 0 0  Change in appetite - 0 0 0 0  Feeling bad or failure about yourself  - 0 0 0 0  Trouble concentrating - 0 0 0 0  Moving slowly or fidgety/restless - 0 0 0 0  Suicidal thoughts - 0 0 0 0  PHQ-9 Score - 0 0 0 0  Difficult doing work/chores - Not difficult at all Not difficult at all - -    Allergies  Allergen Reactions  . Codeine    Social History   Tobacco Use  . Smoking status: Never Smoker  . Smokeless tobacco: Never Used  Substance Use Topics  . Alcohol use: No   Past Medical History:  Diagnosis Date  . Allergy   . Anemia   . Anxiety   . Depression   . Fatigue   . Frequent UTI   . Hyperlipidemia   . Migraine   . Reactive airway disease    patient unaware  RAD  . Renal stones   . Rosacea   . Vertigo    was on antivert   Past Surgical History:  Procedure Laterality Date  . BREAST BIOPSY  1990   benign  . LASIK  2001  . TONSILLECTOMY AND ADENOIDECTOMY  1985  . WISDOM TOOTH EXTRACTION     Family History  Problem Relation Age of Onset  . Diabetes Mother   . Mental illness Mother        Hoarder  . Dementia Mother   . Asthma Mother   . Depression Mother   . Migraines Mother   . Atrial fibrillation Father   . Hypertension Father   . Diabetes Maternal Aunt   . Hearing loss Maternal Aunt   . Stroke Maternal Grandmother   . Arthritis Maternal Grandfather   . Heart disease Maternal Grandfather   . Colon polyps Maternal Grandfather   . Colon polyps Paternal Grandfather   . Colon cancer Neg Hx   . Esophageal cancer Neg Hx   . Rectal cancer Neg Hx   . Stomach cancer Neg Hx    Allergies as of 12/01/2018      Reactions  Codeine       Medication List       Accurate as of December 01, 2018  2:04 PM. If you have any questions, ask your nurse or doctor.        Fish Oil 1000 MG Caps Take 2 capsules by mouth daily. 2150mg  daily   nitrofurantoin (macrocrystal-monohydrate) 100 MG capsule Commonly known as: MACROBID TAKE 1 CAPSULE BY MOUTH AS  NEEDED AFTER INTERCOURSE TO PREVENT UTI   PARoxetine 20 MG tablet Commonly known as: PAXIL Take 1 tablet (20 mg total) by mouth daily.   PROBIOTIC ACIDOPHILUS PO Take 1 capsule by mouth daily.   Red Yeast Rice 600 MG Caps Take 2 capsules by mouth daily.       All past medical history, surgical history, allergies, family history, immunizations andmedications were updated in the EMR today and reviewed under the history and medication portions of their EMR.     ROS: Negative, with the exception of above mentioned in HPI   Objective:  BP 124/79 (BP Location: Left Arm, Patient Position: Sitting, Cuff Size: Normal)   Pulse 82   Temp 97.7 F (36.5 C) (Temporal)   Resp 17   Ht 5\' 5"   (1.651 m)   Wt 151 lb (68.5 kg)   LMP 12/31/2014   SpO2 97%   BMI 25.13 kg/m  Body mass index is 25.13 kg/m. Gen: Afebrile. No acute distress.  HENT: AT. [place].  Eyes:Pupils Equal Round Reactive to light, Extraocular movements intact,  Conjunctiva without redness, discharge or icterus. CV: RRR  Abd: Soft. +TTP suprapubic area. ND. BS present. no Masses palpated.  MSK: no CVA tenderness bilaterally.   Neuro:  Normal gait. PERLA. EOMi. Alert. Oriented x3   No exam data present No results found. No results found for this or any previous visit (from the past 24 hour(s)).  Assessment/Plan: GEMA ACOMB is a 53 y.o. female present for OV for  Urinary frequency -  Hydrate.  - pt elects to continue macrobid- BID until lab resulted. If appropriate will changed therapy or elect to treat w/ macrobid another 7 days if sensitive.  - POCT Urinalysis Dipstick (Automated)>> + leuks and + bld - Urine Culture sent - pt will be called w/ results once available.     Reviewed expectations re: course of current medical issues.  Discussed self-management of symptoms.  Outlined signs and symptoms indicating need for more acute intervention.  Patient verbalized understanding and all questions were answered.  Patient received an After-Visit Summary.    Orders Placed This Encounter  Procedures  . POCT urinalysis dipstick     Note is dictated utilizing voice recognition software. Although note has been proof read prior to signing, occasional typographical errors still can be missed. If any questions arise, please do not hesitate to call for verification.   electronically signed by:  Howard Pouch, DO  [place]

## 2018-12-01 NOTE — Patient Instructions (Signed)
It was nice to see you today.  We have sent the urine for culture and will call you Friday with results.  Hydrate.

## 2018-12-03 LAB — URINE CULTURE
MICRO NUMBER:: 989192
SPECIMEN QUALITY:: ADEQUATE

## 2018-12-14 ENCOUNTER — Other Ambulatory Visit: Payer: Self-pay | Admitting: Family Medicine

## 2018-12-14 DIAGNOSIS — Z1231 Encounter for screening mammogram for malignant neoplasm of breast: Secondary | ICD-10-CM

## 2018-12-16 ENCOUNTER — Other Ambulatory Visit: Payer: Self-pay

## 2018-12-16 ENCOUNTER — Ambulatory Visit
Admission: RE | Admit: 2018-12-16 | Discharge: 2018-12-16 | Disposition: A | Discharge status: Home / Self Care | Payer: 59 | Source: Ambulatory Visit | Attending: Family Medicine | Admitting: Family Medicine

## 2018-12-16 DIAGNOSIS — Z1231 Encounter for screening mammogram for malignant neoplasm of breast: Secondary | ICD-10-CM

## 2018-12-20 ENCOUNTER — Other Ambulatory Visit: Payer: Self-pay | Admitting: Family Medicine

## 2018-12-20 DIAGNOSIS — N6489 Other specified disorders of breast: Secondary | ICD-10-CM

## 2018-12-22 ENCOUNTER — Encounter: Payer: 59 | Admitting: Family Medicine

## 2018-12-23 ENCOUNTER — Ambulatory Visit
Admission: RE | Admit: 2018-12-23 | Discharge: 2018-12-23 | Disposition: A | Discharge status: Home / Self Care | Payer: 59 | Source: Ambulatory Visit | Attending: Family Medicine | Admitting: Family Medicine

## 2018-12-23 ENCOUNTER — Other Ambulatory Visit: Payer: Self-pay

## 2018-12-23 DIAGNOSIS — N6489 Other specified disorders of breast: Secondary | ICD-10-CM

## 2019-01-11 ENCOUNTER — Telehealth: Payer: Self-pay | Admitting: Family Medicine

## 2019-01-11 NOTE — Telephone Encounter (Signed)
[place] having chills with congestion. Patient needs an order to get an COVID test by her physician. Please call patient.

## 2019-01-11 NOTE — Telephone Encounter (Signed)
Pt was called with no answer, VM was let letting patient know we are unable to send a order out of town for her to get tested. She was advised to call local health dept or local pharmacy where she is at to see where she can go get tested. Urgent cares where she is can also tell her where to go.,

## 2019-01-19 ENCOUNTER — Other Ambulatory Visit: Payer: Self-pay

## 2019-01-20 ENCOUNTER — Ambulatory Visit (INDEPENDENT_AMBULATORY_CARE_PROVIDER_SITE_OTHER): Payer: 59 | Admitting: Family Medicine

## 2019-01-20 ENCOUNTER — Encounter: Payer: Self-pay | Admitting: Family Medicine

## 2019-01-20 ENCOUNTER — Telehealth: Payer: Self-pay | Admitting: Family Medicine

## 2019-01-20 VITALS — BP 110/72 | HR 78 | Temp 98.0°F | Resp 15 | Ht 64.5 in | Wt 149.5 lb

## 2019-01-20 DIAGNOSIS — Z131 Encounter for screening for diabetes mellitus: Secondary | ICD-10-CM

## 2019-01-20 DIAGNOSIS — Z5320 Procedure and treatment not carried out because of patient's decision for unspecified reasons: Secondary | ICD-10-CM | POA: Diagnosis not present

## 2019-01-20 DIAGNOSIS — Z0000 Encounter for general adult medical examination without abnormal findings: Secondary | ICD-10-CM | POA: Diagnosis not present

## 2019-01-20 DIAGNOSIS — F410 Panic disorder [episodic paroxysmal anxiety] without agoraphobia: Secondary | ICD-10-CM | POA: Diagnosis not present

## 2019-01-20 DIAGNOSIS — K635 Polyp of colon: Secondary | ICD-10-CM | POA: Diagnosis not present

## 2019-01-20 DIAGNOSIS — E785 Hyperlipidemia, unspecified: Secondary | ICD-10-CM

## 2019-01-20 DIAGNOSIS — F411 Generalized anxiety disorder: Secondary | ICD-10-CM

## 2019-01-20 DIAGNOSIS — E663 Overweight: Secondary | ICD-10-CM | POA: Insufficient documentation

## 2019-01-20 LAB — COMPREHENSIVE METABOLIC PANEL
ALT: 18 U/L (ref 0–35)
AST: 23 U/L (ref 0–37)
Albumin: 4.2 g/dL (ref 3.5–5.2)
Alkaline Phosphatase: 60 U/L (ref 39–117)
BUN: 10 mg/dL (ref 6–23)
CO2: 26 mEq/L (ref 19–32)
Calcium: 9.1 mg/dL (ref 8.4–10.5)
Chloride: 105 mEq/L (ref 96–112)
Creatinine, Ser: 0.65 mg/dL (ref 0.40–1.20)
GFR: 95.03 mL/min (ref >60.00)
Glucose, Bld: 129 mg/dL — ABNORMAL HIGH (ref 70–99)
Potassium: 4 mEq/L (ref 3.5–5.1)
Sodium: 139 mEq/L (ref 135–145)
Total Bilirubin: 0.6 mg/dL (ref 0.2–1.2)
Total Protein: 5.9 g/dL — ABNORMAL LOW (ref 6.0–8.3)

## 2019-01-20 LAB — LIPID PANEL
Cholesterol: 266 mg/dL — ABNORMAL HIGH (ref 0–200)
HDL: 65 mg/dL (ref >39.00)
LDL Cholesterol: 187 mg/dL — ABNORMAL HIGH (ref 0–99)
NonHDL: 201.4
Total CHOL/HDL Ratio: 4
Triglycerides: 70 mg/dL (ref 0.0–149.0)
VLDL: 14 mg/dL (ref 0.0–40.0)

## 2019-01-20 LAB — CBC
HCT: 37.9 % (ref 36.0–46.0)
Hemoglobin: 12.7 g/dL (ref 12.0–15.0)
MCHC: 33.4 g/dL (ref 30.0–36.0)
MCV: 92.2 fl (ref 78.0–100.0)
Platelets: 262 10*3/uL (ref 150.0–400.0)
RBC: 4.12 Mil/uL (ref 3.87–5.11)
RDW: 13.1 % (ref 11.5–15.5)
WBC: 4.3 10*3/uL (ref 4.0–10.5)

## 2019-01-20 LAB — TSH: TSH: 2.12 u[IU]/mL (ref 0.35–4.50)

## 2019-01-20 LAB — HEMOGLOBIN A1C: Hgb A1c MFr Bld: 5.3 % (ref 4.6–6.5)

## 2019-01-20 NOTE — Telephone Encounter (Signed)
Patient was here today for physical but pap smear was not done. Patient was called to schedule her follow up but she could not remember when she was to come back so that insurance could cover her pap smear. Is it 4-6 weeks OR 4-6 months?  Please advise. No need to call patient back. I will follow up with her to schedule appt.  Thank you

## 2019-01-20 NOTE — Patient Instructions (Signed)
Health Maintenance, Female Adopting a healthy lifestyle and getting preventive care are important in promoting health and wellness. Ask your health care provider about:  The right schedule for you to have regular tests and exams.  Things you can do on your own to prevent diseases and keep yourself healthy. What should I know about diet, weight, and exercise? Eat a healthy diet   Eat a diet that includes plenty of vegetables, fruits, low-fat dairy products, and lean protein.  Do not eat a lot of foods that are high in solid fats, added sugars, or sodium. Maintain a healthy weight Body mass index (BMI) is used to identify weight problems. It estimates body fat based on height and weight. Your health care provider can help determine your BMI and help you achieve or maintain a healthy weight. Get regular exercise Get regular exercise. This is one of the most important things you can do for your health. Most adults should:  Exercise for at least 150 minutes each week. The exercise should increase your heart rate and make you sweat (moderate-intensity exercise).  Do strengthening exercises at least twice a week. This is in addition to the moderate-intensity exercise.  Spend less time sitting. Even light physical activity can be beneficial. Watch cholesterol and blood lipids Have your blood tested for lipids and cholesterol at 53 years of age, then have this test every 5 years. Have your cholesterol levels checked more often if:  Your lipid or cholesterol levels are high.  You are older than 53 years of age.  You are at high risk for heart disease. What should I know about cancer screening? Depending on your health history and family history, you may need to have cancer screening at various ages. This may include screening for:  Breast cancer.  Cervical cancer.  Colorectal cancer.  Skin cancer.  Lung cancer. What should I know about heart disease, diabetes, and high blood  pressure? Blood pressure and heart disease  High blood pressure causes heart disease and increases the risk of stroke. This is more likely to develop in people who have high blood pressure readings, are of African descent, or are overweight.  Have your blood pressure checked: ? Every 3-5 years if you are 18-39 years of age. ? Every year if you are 40 years old or older. Diabetes Have regular diabetes screenings. This checks your fasting blood sugar level. Have the screening done:  Once every three years after age 40 if you are at a normal weight and have a low risk for diabetes.  More often and at a younger age if you are overweight or have a high risk for diabetes. What should I know about preventing infection? Hepatitis B If you have a higher risk for hepatitis B, you should be screened for this virus. Talk with your health care provider to find out if you are at risk for hepatitis B infection. Hepatitis C Testing is recommended for:  Everyone born from 1945 through 1965.  Anyone with known risk factors for hepatitis C. Sexually transmitted infections (STIs)  Get screened for STIs, including gonorrhea and chlamydia, if: ? You are sexually active and are younger than 53 years of age. ? You are older than 53 years of age and your health care provider tells you that you are at risk for this type of infection. ? Your sexual activity has changed since you were last screened, and you are at increased risk for chlamydia or gonorrhea. Ask your health care provider if   you are at risk.  Ask your health care provider about whether you are at high risk for HIV. Your health care provider may recommend a prescription medicine to help prevent HIV infection. If you choose to take medicine to prevent HIV, you should first get tested for HIV. You should then be tested every 3 months for as long as you are taking the medicine. Pregnancy  If you are about to stop having your period (premenopausal) and  you may become pregnant, seek counseling before you get pregnant.  Take 400 to 800 micrograms (mcg) of folic acid every day if you become pregnant.  Ask for birth control (contraception) if you want to prevent pregnancy. Osteoporosis and menopause Osteoporosis is a disease in which the bones lose minerals and strength with aging. This can result in bone fractures. If you are 65 years old or older, or if you are at risk for osteoporosis and fractures, ask your health care provider if you should:  Be screened for bone loss.  Take a calcium or vitamin D supplement to lower your risk of fractures.  Be given hormone replacement therapy (HRT) to treat symptoms of menopause. Follow these instructions at home: Lifestyle  Do not use any products that contain nicotine or tobacco, such as cigarettes, e-cigarettes, and chewing tobacco. If you need help quitting, ask your health care provider.  Do not use street drugs.  Do not share needles.  Ask your health care provider for help if you need support or information about quitting drugs. Alcohol use  Do not drink alcohol if: ? Your health care provider tells you not to drink. ? You are pregnant, may be pregnant, or are planning to become pregnant.  If you drink alcohol: ? Limit how much you use to 0-1 drink a day. ? Limit intake if you are breastfeeding.  Be aware of how much alcohol is in your drink. In the [place] one drink equals one 12 oz bottle of beer (355 mL), one 5 oz glass of wine (148 mL), or one 1 oz glass of hard liquor (44 mL). General instructions  Schedule regular health, dental, and eye exams.  Stay current with your vaccines.  Tell your health care provider if: ? You often feel depressed. ? You have ever been abused or do not feel safe at home. Summary  Adopting a healthy lifestyle and getting preventive care are important in promoting health and wellness.  Follow your health care provider's instructions about healthy  diet, exercising, and getting tested or screened for diseases.  Follow your health care provider's instructions on monitoring your cholesterol and blood pressure. This information is not intended to replace advice given to you by your health care provider. Make sure you discuss any questions you have with your health care provider. Document Released: 08/19/2010 Document Revised: 01/27/2018 Document Reviewed: 01/27/2018 Elsevier Patient Education  2020 [employer]  

## 2019-01-20 NOTE — Telephone Encounter (Signed)
She can come anytime to get the pap smear and her follow up for her chronic medical conditions and refills in 6 months

## 2019-01-20 NOTE — Progress Notes (Signed)
Patient ID: Carla Beltran, female  DOB: August 30, 1965, 53 y.o.   MRN: [id number] Patient Care Team    Relationship Specialty Notifications Start End  [name], DO PCP - General Family Medicine  05/28/16   Doran Stabler, MD Consulting Physician Gastroenterology  03/13/17     Chief Complaint  Patient presents with  . Annual Exam    Fasting. Patient would not like pap smear today due to having migraine. Pt is tearful in room talking about mom being tested for COVID and father having health issues, she states she is under a lot stress     Subjective:  Carla Beltran is a 53 y.o.  Female  present for CPE. All past medical history, surgical history, allergies, family history, immunizations, medications and social history were updated in the electronic medical record today. All recent labs, ED visits and hospitalizations within the last year were reviewed.  Anxiety/phobia: Patient reports today that her anxiety is significantly increased.  Last visit she was doing very well and adjusting to the pandemic.  She was actually enjoying having her family home in the art projects she was able to complete.  She was also walking routinely with her husband, which she was enjoying.  Today she states she thinks the increase in anxiety is temporary secondary to an issue she is having with her family.  Her father lives in [place] and has medical conditions .  Her stepmother is currently in a nursing home and tested positive for Covid.  She states she is doing well with her Covid infection and does not have symptoms.  However her father is very stressed out and that is increasing Carla Beltran's anxiety.  Her mother lives in [place] in a nursing home and she was recently called secondary to her having a fever.  She reports her mother already had a Covid infection earlier in the year, but it has her stressed that her mother is ill.  Patient also has a migraine today secondary to increased stress.  Original  note on establishment: Patient presents for establishment today and admits to increased anxiety and panic attacks. She has fears that she has trouble controlling. She worries about everything. She has a strong fear of driving on the [place], especially in areas she is unfamiliar with. She endorses panic attacks that occur during driving and at other times. She states she experiences a increased heart rate and then feeling like she is "out of her body ". She reports increased anxiety in her life especially around times when she is moving, such as currently. She states she's always had anxiety. She reports she did not have a great childhood. Her mother now has dementia and is in [place] in a nursing facility, this also caused her increased anxiety. Her mother with hoarder. She has 2 daughters, which also are affected by anxiety and are on Celexa. She states her daughters are not affected by his much panic she is. She reports being on Zoloft since 2006. She was initially on 25 mg of Zoloft daily, and eventually increased up to Zoloft 50 mg daily. Prior to moving in August 2017 her prior provider told her to increase to 75 mg daily. She states that she did not like the apathetic side effects she experienced at 75 mg dose, so she decreased back down to the 50 mg dose daily. She reports being seen by counselor many times in the past. She has seek the help of a  Marketing executive in [place]). She has yet to establish with her.  Hyperlipidemia/overweight/declined statin: She has rather significantly elevated lipids.  She has refused statins.  She has been counseled on the benefits of statins.  She has a family history of stroke.  She states she  takes red yeast rice and fish oil.  Health maintenance:  Colonoscopy: Completed 03/04/2017; polyps, 3 year follow up. Mammogram: completed: 11/20- abnl- with normal right repeat.  Cervical cancer screening: last pap:02/2014 per pt,"always  normal">> declines today- wants to wait.  Immunizations: tdap2014 UTD, Influenza declined(encouraged yearly). Shingrix series completed.  Infectious disease screening: HIVcompleted 1998 DEXA:N/A Assistive device: none Oxygen KGM:WNUU Patient has a Dental home. Hospitalizations/ED visits: reviewed  Depression screen [facility] 2/9 01/20/2019 09/15/2018 03/16/2018 10/23/2017 03/16/2017  Decreased Interest 0 0 0 0 0  Down, Depressed, Hopeless 0 0 0 0 0  PHQ - 2 Score 0 0 0 0 0  Altered sleeping - - 0 0 0  Tired, decreased energy - - 0 0 0  Change in appetite - - 0 0 0  Feeling bad or failure about yourself  - - 0 0 0  Trouble concentrating - - 0 0 0  Moving slowly or fidgety/restless - - 0 0 0  Suicidal thoughts - - 0 0 0  PHQ-9 Score - - 0 0 0  Difficult doing work/chores - - Not difficult at all Not difficult at all -   GAD 7 : Generalized Anxiety Score 01/20/2019 09/15/2018 03/16/2018 09/14/2017  Nervous, Anxious, on Edge 0 0 0 0  Control/stop worrying 0 0 0 0  Worry too much - different things 0 0 0 0  Trouble relaxing 0 0 0 0  Restless 0 0 0 0  Easily annoyed or irritable 0 0 0 0  Afraid - awful might happen 0 0 0 0  Total GAD 7 Score 0 0 0 0  Anxiety Difficulty Not difficult at all Not difficult at all Not difficult at all Not difficult at all    Immunization History  Administered Date(s) Administered  . Tdap 10/26/2012  . Zoster Recombinat (Shingrix) 09/16/2017, 11/26/2017     Past Medical History:  Diagnosis Date  . Allergy   . Anemia   . Anxiety   . Depression   . Fatigue   . Frequent UTI   . Hyperlipidemia   . Migraine   . Reactive airway disease    patient unaware RAD  . Renal stones   . Rosacea   . Vertigo    was on antivert   Allergies  Allergen Reactions  . Codeine    Past Surgical History:  Procedure Laterality Date  . BREAST BIOPSY  1990   benign  . BREAST EXCISIONAL BIOPSY Right 30+ yrs ago   benign  . LASIK  2001  . TONSILLECTOMY AND  ADENOIDECTOMY  1985  . WISDOM TOOTH EXTRACTION     Family History  Problem Relation Age of Onset  . Diabetes Mother   . Mental illness Mother        Hoarder  . Dementia Mother   . Asthma Mother   . Depression Mother   . Migraines Mother   . Atrial fibrillation Father   . Hypertension Father   . Diabetes Maternal Aunt   . Hearing loss Maternal Aunt   . Stroke Maternal Grandmother   . Arthritis Maternal Grandfather   . Heart disease Maternal Grandfather   . Colon polyps Maternal Grandfather   . Colon polyps  Paternal Grandfather   . Colon cancer Neg Hx   . Esophageal cancer Neg Hx   . Rectal cancer Neg Hx   . Stomach cancer Neg Hx    Social History   Social History Narrative   Married to [place]. Two adult children [name] and [name] (in college).    Moved from [place] [facility]  area) 09/2015.    SAHM. College educated.    Drinks one cup of coffee a day.   Takes a daily vitamin.   Wears seatbelts, bicycle helmet and smoke detector in the home.   Tries to exercise routinely.   Feels safe in her relationships.    Allergies as of 01/20/2019      Reactions   Codeine       Medication List       Accurate as of January 20, 2019  9:12 AM. If you have any questions, ask your nurse or doctor.        Fish Oil 1000 MG Caps Take 2 capsules by mouth daily. 2138m daily   nitrofurantoin (macrocrystal-monohydrate) 100 MG capsule Commonly known as: MACROBID TAKE 1 CAPSULE BY MOUTH AS  NEEDED AFTER INTERCOURSE TO PREVENT UTI   PARoxetine 20 MG tablet Commonly known as: PAXIL Take 1 tablet (20 mg total) by mouth daily.   PROBIOTIC ACIDOPHILUS PO Take 1 capsule by mouth daily.   Red Yeast Rice 600 MG Caps Take 2 capsules by mouth daily.       All past medical history, surgical history, allergies, family history, immunizations andmedications were updated in the EMR today and reviewed under the history and medication portions of their EMR.      [employer]  Axilla  Result Date: 12/23/2018 CLINICAL DATA:  Screening recall for an asymmetry seen in the lower posterior right breast on the MLO view only. EXAM: DIGITAL DIAGNOSTIC UNILATERAL RIGHT MAMMOGRAM WITH CAD AND TOMO RIGHT BREAST ULTRASOUND COMPARISON:  Previous exam(s). ACR Breast Density Category c: The breast tissue is heterogeneously dense, which may obscure small masses. FINDINGS: Additional tomograms were performed of the right breast. The initially questioned possible right breast asymmetry appears to resolve on the additional imaging with findings suggestive of an area of dense fibroglandular tissue. Mammographic images were processed with CAD. Targeted ultrasound of the central and outer right breast was performed. No suspicious masses or abnormality seen, only heterogeneous fibroglandular tissue identified. IMPRESSION: No findings of malignancy in the right breast. RECOMMENDATION: Screening mammogram in one year.(Code:SM-B-01Y) I have discussed the findings and recommendations with the patient. If applicable, a reminder letter will be sent to the patient regarding the next appointment. BI-RADS CATEGORY  1: Negative. Electronically Signed   By: JEverlean AlstromM.D.   On: 12/23/2018 09:21     ROS: 14 pt review of systems performed and negative (unless mentioned in an HPI)  Objective: BP 110/72 (BP Location: Left Arm, Patient Position: Lying right side, Cuff Size: Normal)   Pulse 78   Temp 98 F (36.7 C) (Temporal)   Resp 15   Ht 5' 4.5" (1.638 m)   Wt 149 lb 8 oz (67.8 kg)   LMP 12/31/2014   SpO2 97%   BMI 25.27 kg/m  Gen: Afebrile. No acute distress. Nontoxic in appearance, well-developed, well-nourished,  Pleasant caucasian female. Has a migraine today HENT: AT. [place]. Bilateral TM visualized and normal in appearance, normal external auditory canal. MMM, no oral lesions, adequate dentition. Bilateral nares within normal limits. Throat without erythema, ulcerations or exudates.  no Cough on  exam, no hoarseness on exam. Eyes:Pupils Equal Round Reactive to light, Extraocular movements intact,  Conjunctiva without redness, discharge or icterus. Neck/lymp/endocrine: Supple,no lymphadenopathy, no thyromegaly CV: RRR no murmur, no edema, +2/4 P posterior tibialis pulses. no carotid bruits. No JVD. Chest: CTAB, no wheeze, rhonchi or crackles. normal Respiratory effort. good Air movement. Abd: Soft. flat. NTND. BS present. no Masses palpated. No hepatosplenomegaly. No rebound tenderness or guarding. Skin: no rashes, purpura or petechiae. Warm and well-perfused. Skin intact. Neuro/Msk:  Normal gait. PERLA. EOMi. Alert. Oriented x3.  Cranial nerves II through XII intact. Muscle strength 5/5 upper/lower extremity. DTRs equal bilaterally. Psych: Normal affect, dress and demeanor. Normal speech. Normal thought content and judgment.   No exam data present  Assessment/plan: DURU REIGER is a 53 y.o. female present for CPE Generalized anxiety disorder/panic attacks - increased anxiety today.  Patient feels it is temporary and would like to remain on same dose.  Refills on paxil 20 g QD provided.  -Recommended light box therapy in the winter.  - TSH - PARoxetine (PAXIL) 20 MG tablet; Take 1 tablet (20 mg total) by mouth daily.  Dispense: 90 tablet; Refill: 1 - continue Marketing executive.. - F/U every 6 months, unless needed sooner  Hyperlipidemia/statin declined: - LDL greater than 200.  Patient strongly encouraged to reconsider statin use- declines.  She is taking red yeast rice and fish oil. -Depending upon cholesterol levels, will offer her referral to cardiology/lipid clinic. - CBC, CMP, TSH, lipid Polyp of colon, unspecified part of colon, unspecified type Rpt due 2022- 3 yr rpt.  Diabetes mellitus screening - HgB A1c Encounter for preventive health examination Patient was encouraged to exercise greater than 150 minutes a week. Patient was encouraged to choose a diet filled with  fresh fruits and vegetables, and lean meats. AVS provided to patient today for education/recommendation on gender specific health and safety maintenance. Colonoscopy: Completed 03/04/2017; polyps, 3 year follow up. Mammogram: completed: 11/20- abnl- with normal right repeat.  Cervical cancer screening: last pap:02/2014 per pt,"always normal">> declines today- wants to wait.  Immunizations: tdap2014 UTD, Influenza declined(encouraged yearly). Shingrix series completed.  Infectious disease screening: HIVcompleted 1998 DEXA:N/A  Return in about 1 year (around 01/20/2020) for CPE (30 min). 6 months for chronic medical condition.  Orders Placed This Encounter  Procedures  . CBC  . Comp Met (CMET)  . HgB A1c  . Lipid panel  . TSH   This visit occurred during the SARS-CoV-2 public health emergency.  Safety protocols were in place, including screening questions prior to the visit, additional usage of staff PPE, and extensive cleaning of exam room while observing appropriate contact time as indicated for disinfecting solutions.    Electronically signed by: Howard Pouch, DO [place]

## 2019-01-24 ENCOUNTER — Telehealth: Payer: Self-pay | Admitting: Family Medicine

## 2019-01-24 NOTE — Telephone Encounter (Signed)
Pt was called and given information. She does not want to start a statin or go to cardiologist. She would like to try a more holistic approach with diet and exercise. She has been exercising more.   Patient would like to know if blood sugar can be raised with migraine? She is concerned with her glucose level since she was fasting. Explained to patient her A1C results and what it was, she still is concerned with sugar level.

## 2019-01-24 NOTE — Telephone Encounter (Signed)
Please inform patient the following information: Her liver, kidney, thyroid function are all normal. Her blood counts are in normal range. Her diabetes screen is negative/normal. Her cholesterol is above goal, although mildly improved from last year.  With a total cholesterol of 266 and a LDL/bad cholesterol 187, triglycerides were 70.  She is at higher cardiovascular risk with her LDL levels.    She has declined statins and cholesterol medications in the past.  I would recommend not only increasing exercise and dietary modifications, but also at least a low-dose statin to give her cardiovascular protection.  In addition, if she would like to be evaluated by a cardiologist we could also refer her to the lipid/cholesterol clinic.  There are other medications to help with cholesterol besides statins.  However, they are delivered by injection every 2 weeks at home by patient.  Please advise if she would be amendable to starting a statin or if she would like referral to cardiology.

## 2019-01-24 NOTE — Telephone Encounter (Signed)
Blood sugars can be higher with any type of illness or stresses on the body. Her a1c was normal>> if she had diabetes or prediabetes>> this would be above 5.8, and it is normal.   We can repeat a fasting glucose in 2 weeks to be certain- please place order for her and schedule

## 2019-01-24 NOTE — Telephone Encounter (Signed)
Pt was called and message was left to return call  

## 2019-01-25 NOTE — Telephone Encounter (Signed)
Pt was called and given information. She verbalized understanding. She would like to get a fasting glucose done but cannot schedule right now. She will call back to schedule.

## 2019-01-28 ENCOUNTER — Other Ambulatory Visit: Payer: Self-pay | Admitting: Family Medicine

## 2019-01-28 DIAGNOSIS — F411 Generalized anxiety disorder: Secondary | ICD-10-CM

## 2019-01-28 DIAGNOSIS — F410 Panic disorder [episodic paroxysmal anxiety] without agoraphobia: Secondary | ICD-10-CM

## 2019-05-05 ENCOUNTER — Other Ambulatory Visit: Payer: Self-pay | Admitting: Family Medicine

## 2019-05-05 DIAGNOSIS — F410 Panic disorder [episodic paroxysmal anxiety] without agoraphobia: Secondary | ICD-10-CM

## 2019-05-05 DIAGNOSIS — F411 Generalized anxiety disorder: Secondary | ICD-10-CM

## 2019-05-24 ENCOUNTER — Other Ambulatory Visit: Payer: Self-pay

## 2019-05-24 ENCOUNTER — Encounter: Payer: Self-pay | Admitting: Family Medicine

## 2019-05-24 ENCOUNTER — Ambulatory Visit (INDEPENDENT_AMBULATORY_CARE_PROVIDER_SITE_OTHER): Payer: BC Managed Care – PPO | Admitting: Family Medicine

## 2019-05-24 ENCOUNTER — Telehealth: Payer: Self-pay | Admitting: Family Medicine

## 2019-05-24 ENCOUNTER — Other Ambulatory Visit (HOSPITAL_COMMUNITY)
Admission: RE | Admit: 2019-05-24 | Discharge: 2019-05-24 | Disposition: A | Discharge status: Home / Self Care | Payer: BC Managed Care – PPO | Source: Ambulatory Visit | Attending: Family Medicine | Admitting: Family Medicine

## 2019-05-24 VITALS — BP 112/77 | HR 82 | Temp 97.3°F | Resp 17 | Ht 65.0 in | Wt 155.5 lb

## 2019-05-24 DIAGNOSIS — E782 Mixed hyperlipidemia: Secondary | ICD-10-CM | POA: Diagnosis not present

## 2019-05-24 DIAGNOSIS — Z124 Encounter for screening for malignant neoplasm of cervix: Secondary | ICD-10-CM

## 2019-05-24 DIAGNOSIS — N390 Urinary tract infection, site not specified: Secondary | ICD-10-CM

## 2019-05-24 DIAGNOSIS — F410 Panic disorder [episodic paroxysmal anxiety] without agoraphobia: Secondary | ICD-10-CM

## 2019-05-24 DIAGNOSIS — F411 Generalized anxiety disorder: Secondary | ICD-10-CM

## 2019-05-24 DIAGNOSIS — N952 Postmenopausal atrophic vaginitis: Secondary | ICD-10-CM

## 2019-05-24 DIAGNOSIS — Z5320 Procedure and treatment not carried out because of patient's decision for unspecified reasons: Secondary | ICD-10-CM

## 2019-05-24 DIAGNOSIS — E663 Overweight: Secondary | ICD-10-CM

## 2019-05-24 MED ORDER — PAROXETINE HCL 20 MG PO TABS: 20.0000 mg | ORAL_TABLET | Freq: Every day | ORAL | 1 refills | Status: DC

## 2019-05-24 MED ORDER — ESTRADIOL 0.1 MG/GM VA CREA: TOPICAL_CREAM | VAGINAL | 12 refills | Status: DC

## 2019-05-24 MED ORDER — NITROFURANTOIN MONOHYD MACRO 100 MG PO CAPS: ORAL_CAPSULE | ORAL | 1 refills | Status: DC

## 2019-05-24 NOTE — Telephone Encounter (Signed)
Please inform patient: Unfortunately, I cannot tell from our end of prescribing which form of estrogen cream versus intravaginal estrogen pill is on her formulary. I have called in the generic form of Estrace cream.  Follow instructions on the label.  It is to be used 2 g nightly for 2 weeks, then decrease dose to 1 g nightly for only 2 days a week.  This may seem costly to her, however she will be using a very small amount after the first 2 weeks.  The estrogen cream will help both with her urinary symptoms and her vaginal dryness.  Although the risks and side effects for topical estrogen creams are very minimal in comparison to the oral estrogen supplementation and typically very well-tolerated, there is not a "no risk" label with the use of these medications.  I have sent the medication to [facility].

## 2019-05-24 NOTE — Progress Notes (Signed)
Patient ID: Carla Beltran, female  DOB: 1965-12-10, 54 y.o.   MRN: [id number] Patient Care Team    Relationship Specialty Notifications Start End  [name], DO PCP - General Family Medicine  05/28/16   Doran Stabler, MD Consulting Physician Gastroenterology  03/13/17     Chief Complaint  Patient presents with  . Anxiety    Pt would pap smear done today due to having migraine at CPE in 01/2020.     Subjective:  Carla Beltran is a 54 y.o.  Female  present for [facility] and cervical cancer screen Anxiety/phobia:  Patient reports her medication is helping with her anxiety and working appropriately.  She is having some grief after the passing of her mother last week.  Overall she states she is doing well handling the grief.  Original note on establishment: Patient presents for establishment today and admits to increased anxiety and panic attacks. She has fears that she has trouble controlling. She worries about everything. She has a strong fear of driving on the [place], especially in areas she is unfamiliar with. She endorses panic attacks that occur during driving and at other times. She states she experiences a increased heart rate and then feeling like she is "out of her body ". She reports increased anxiety in her life especially around times when she is moving, such as currently. She states she's always had anxiety. She reports she did not have a great childhood. Her mother now has dementia and is in [place] in a nursing facility, this also caused her increased anxiety. Her mother with hoarder. She has 2 daughters, which also are affected by anxiety and are on Celexa. She states her daughters are not affected by his much panic she is. She reports being on Zoloft since 2006. She was initially on 25 mg of Zoloft daily, and eventually increased up to Zoloft 50 mg daily. Prior to moving in August 2017 her prior provider told her to increase to 75 mg daily. She states that she  did not like the apathetic side effects she experienced at 75 mg dose, so she decreased back down to the 50 mg dose daily. She reports being seen by counselor many times in the past. She has seek the help of a Marketing executive in [place] Leanor Rubenstein). She has yet to establish with her.  Cervical cancer screening: Patient's last menstrual period was 12/31/2014. G4P2.  Last cervical cancer screening 06/2014.  Patient denies abnormal Paps in the past.  She is postmenopausal and has noticed vaginal dryness that has worsened over the last few months.  She reports this is caused some burning during urination and after sexual intercourse.  She does have more frequent UTIs historically.  She has no personal history of breast cancer and no family history of breast cancer.  Her mammograms are up-to-date  Depression screen [facility] 2/9 05/24/2019 01/20/2019 09/15/2018 03/16/2018 10/23/2017  Decreased Interest 0 0 0 0 0  Down, Depressed, Hopeless 0 0 0 0 0  PHQ - 2 Score 0 0 0 0 0  Altered sleeping - - - 0 0  Tired, decreased energy - - - 0 0  Change in appetite - - - 0 0  Feeling bad or failure about yourself  - - - 0 0  Trouble concentrating - - - 0 0  Moving slowly or fidgety/restless - - - 0 0  Suicidal thoughts - - - 0 0  PHQ-9 Score - - -  0 0  Difficult doing work/chores - - - Not difficult at all Not difficult at all   GAD 7 : Generalized Anxiety Score 05/24/2019 01/20/2019 09/15/2018 03/16/2018  Nervous, Anxious, on Edge 0 0 0 0  Control/stop worrying 0 0 0 0  Worry too much - different things 0 0 0 0  Trouble relaxing 0 0 0 0  Restless 0 0 0 0  Easily annoyed or irritable 0 0 0 0  Afraid - awful might happen 0 0 0 0  Total GAD 7 Score 0 0 0 0  Anxiety Difficulty Not difficult at all Not difficult at all Not difficult at all Not difficult at all    Immunization History  Administered Date(s) Administered  . Tdap 10/26/2012  . Zoster Recombinat (Shingrix) 09/16/2017, 11/26/2017     Past  Medical History:  Diagnosis Date  . Allergy   . Anemia   . Anxiety   . Depression   . Fatigue   . Frequent UTI   . Hyperlipidemia   . Migraine   . Reactive airway disease    patient unaware RAD  . Renal stones   . Rosacea   . Vertigo    was on antivert   Allergies  Allergen Reactions  . Codeine    Past Surgical History:  Procedure Laterality Date  . BREAST BIOPSY  1990   benign  . BREAST EXCISIONAL BIOPSY Right 30+ yrs ago   benign  . LASIK  2001  . TONSILLECTOMY AND ADENOIDECTOMY  1985  . WISDOM TOOTH EXTRACTION     Family History  Problem Relation Age of Onset  . Diabetes Mother   . Mental illness Mother        Hoarder  . Dementia Mother   . Asthma Mother   . Depression Mother   . Migraines Mother   . Atrial fibrillation Father   . Hypertension Father   . Diabetes Maternal Aunt   . Hearing loss Maternal Aunt   . Stroke Maternal Grandmother   . Arthritis Maternal Grandfather   . Heart disease Maternal Grandfather   . Colon polyps Maternal Grandfather   . Colon polyps Paternal Grandfather   . Colon cancer Neg Hx   . Esophageal cancer Neg Hx   . Rectal cancer Neg Hx   . Stomach cancer Neg Hx    Social History   Social History Narrative   Married to [place]. Two adult children [name] and [name] (in college).    Moved from [place] [facility]  area) 09/2015.    SAHM. College educated.    Drinks one cup of coffee a day.   Takes a daily vitamin.   Wears seatbelts, bicycle helmet and smoke detector in the home.   Tries to exercise routinely.   Feels safe in her relationships.    Allergies as of 05/24/2019      Reactions   Codeine       Medication List       Accurate as of May 24, 2019 12:59 PM. If you have any questions, ask your nurse or doctor.        estradiol 0.1 MG/GM vaginal cream Commonly known as: ESTRACE 2 gram intravaginally for 2 weeks. Then decrease to 1 gram two days a week. Started by: Howard Pouch, DO   Fish Oil 1000 MG Caps Take  2 capsules by mouth daily. 2150mg  daily   nitrofurantoin (macrocrystal-monohydrate) 100 MG capsule Commonly known as: MACROBID TAKE 1 CAPSULE BY MOUTH AS  NEEDED AFTER  INTERCOURSE TO PREVENT UTI   PARoxetine 20 MG tablet Commonly known as: PAXIL Take 1 tablet (20 mg total) by mouth daily.   PROBIOTIC ACIDOPHILUS PO Take 1 capsule by mouth daily.   Red Yeast Rice 600 MG Caps Take 2 capsules by mouth daily.       All past medical history, surgical history, allergies, family history, immunizations andmedications were updated in the EMR today and reviewed under the history and medication portions of their EMR.      US Breast Ltd Uni Right Inc Axilla  Result Date: 12/23/2018 CLINICAL DATA:  Screening recall for an asymmetry seen in the lower posterior right breast on the MLO view only. EXAM: DIGITAL DIAGNOSTIC UNILATERAL RIGHT MAMMOGRAM WITH CAD AND TOMO RIGHT BREAST ULTRASOUND COMPARISON:  Previous exam(s). ACR Breast Density Category c: The breast tissue is heterogeneously dense, which may obscure small masses. FINDINGS: Additional tomograms were performed of the right breast. The initially questioned possible right breast asymmetry appears to resolve on the additional imaging with findings suggestive of an area of dense fibroglandular tissue. Mammographic images were processed with CAD. Targeted ultrasound of the central and outer right breast was performed. No suspicious masses or abnormality seen, only heterogeneous fibroglandular tissue identified. IMPRESSION: No findings of malignancy in the right breast. RECOMMENDATION: Screening mammogram in one year.(Code:[id number]-B-01Y) I have discussed the findings and recommendations with the patient. If applicable, a reminder letter will be sent to the patient regarding the next appointment. BI-RADS CATEGORY  1: Negative. Electronically Signed   By: Everlean Alstrom M.D.   On: 12/23/2018 09:21     ROS: 14 pt review of systems performed and negative  (unless mentioned in an HPI)  Objective: BP 112/77 (BP Location: Right Arm, Patient Position: Sitting, Cuff Size: Normal)   Pulse 82   Temp (!) 97.3 F (36.3 C) (Temporal)   Resp 17   Ht 5\' 5"  (1.651 m)   Wt 155 lb 8 oz (70.5 kg)   LMP 12/31/2014   SpO2 97%   BMI 25.88 kg/m  Gen: Afebrile. No acute distress. Nontoxic in appearance, well-developed, Gen: Afebrile. No acute distress.  HENT: AT. [place].  Eyes:Pupils Equal Round Reactive to light, Extraocular movements intact,  Conjunctiva without redness, discharge or icterus. Psych: Normal affect, dress and demeanor. Normal speech. Normal thought content and judgment. Breasts: breasts appear normal, symmetrical, no tenderness on exam, no suspicious masses, no skin or nipple changes or axillary nodes. GYN:  External genitalia within normal limits, normal hair distribution, no lesions. Urethral meatus normal, no lesions. Vaginal mucosa pink, vaginal dryness present, mild atrophy of rugae, no lesions. No cystocele or rectocele. cervix without lesions, no discharge. Bimanual exam revealed normal uterus.  No bladder/suprapubic fullness, masses or tenderness. No cervical motion tenderness. No adnexal fullness. Anus and perineum within normal limits, no lesions.  No exam data present  Assessment/plan: Carla Beltran is a 54 y.o. female present for [facility] Generalized anxiety disorder/panic attacks -Stable, although undergoing grief secondary to the passing of her mother last week. -   Continue paxil 20 g QD -Recommended light box therapy in the winter. 1 - continue Marketing executive.. - F/U every 6 months, unless needed sooner   Encounter for Papanicolaou smear for cervical cancer screening/atrophic vaginitis/frequent UTI Patient does have signs of atrophic vaginitis on exam today.  Discussed different types of topical treatments with estrogen cream vs Vagifem. Discussed lower risk profile in comparison to oral estrogens, although obviously does  not come with a "no risk ".  Prescribed Estrace cream with instructions on initiation and then maintenance dosing after 2 weeks. Continue Macrobid postcoital-had discussed taking nightly, which she has not started doing it. - Cytology - PAP( [place]) with HPV co-testing Follow-up at CPE, sooner if needed or any side effects/vaginal bleeding.   Return in about 6 months (around 11/09/2019) for [place] (30 min).   Meds ordered this encounter  Medications  . PARoxetine (PAXIL) 20 MG tablet    Sig: Take 1 tablet (20 mg total) by mouth daily.    Dispense:  90 tablet    Refill:  1    6 month supply only. Refills are provided at appts.  . nitrofurantoin, macrocrystal-monohydrate, (MACROBID) 100 MG capsule    Sig: TAKE 1 CAPSULE BY MOUTH AS  NEEDED AFTER INTERCOURSE TO PREVENT UTI    Dispense:  90 capsule    Refill:  1    Hold until pt request   Referral Orders  No referral(s) requested today    This visit occurred during the SARS-CoV-2 public health emergency.  Safety protocols were in place, including screening questions prior to the visit, additional usage of staff PPE, and extensive cleaning of exam room while observing appropriate contact time as indicated for disinfecting solutions.    Electronically signed by: Howard Pouch, DO [place]

## 2019-05-24 NOTE — Patient Instructions (Addendum)
COVID-19 Vaccine Information can be found at: [email or web address] For questions related to vaccine distribution or appointments, please email [email or web address] or call [phone].  Covid Vaccine appointment go to [email or web address].    Atrophic Vaginitis Atrophic vaginitis is a condition in which the tissues that line the vagina become dry and thin. This condition occurs in women who have stopped having their period. It is caused by a drop in a female hormone (estrogen). This hormone helps:  To keep the vagina moist.  To make a clear fluid. This clear fluid helps: ? To make the vagina ready for sex. ? To protect the vagina from infection. If the lining of the vagina is dry and thin, it may cause irritation, burning, or itchiness. It may also:  Make sex painful.  Make an exam of your vagina painful.  Cause bleeding.  Make you lose interest in sex.  Cause a burning feeling when you pee (urinate).  Cause a brown or yellow fluid to come from your vagina. Some women do not have symptoms. Follow these instructions at home: Medicines  Take over-the-counter and prescription medicines only as told by your doctor.  Do not use herbs or other medicines unless your doctor says it is okay.  Use medicines for for dryness. These include: ? Oils to make the vagina soft. ? Creams. ? Moisturizers. General instructions  Do not douche.  Do not use products that can make your vagina dry. These include: ? Scented sprays. ? Scented tampons. ? Scented soaps.  Sex can help increase blood flow and soften the tissue in the vagina. If it hurts to have sex: ? Tell your partner. ? Use products to make sex more comfortable. Use these only as told by your doctor. Contact a doctor if you:  Have discharge from the vagina that is different than usual.  Have a bad smell coming from your vagina.  Have new symptoms.  Do not  get better.  Get worse. Summary  Atrophic vaginitis is a condition in which the lining of the vagina becomes dry and thin.  This condition affects women who have stopped having their periods.  Treatment may include using products that help make the vagina soft.  Call a doctor if do not get better with treatment. This information is not intended to replace advice given to you by your health care provider. Make sure you discuss any questions you have with your health care provider. Document Revised: 02/16/2017 Document Reviewed: 02/16/2017 Elsevier Patient Education  2020 [employer].

## 2019-05-24 NOTE — Telephone Encounter (Signed)
Pt was called and given all information, she verbalized understanding  

## 2019-05-27 LAB — CYTOLOGY - PAP
Comment: NEGATIVE
Diagnosis: NEGATIVE
High risk HPV: NEGATIVE

## 2019-05-30 ENCOUNTER — Encounter: Payer: Self-pay | Admitting: Family Medicine

## 2019-06-09 ENCOUNTER — Ambulatory Visit: Payer: BC Managed Care – PPO | Attending: Internal Medicine

## 2019-06-09 DIAGNOSIS — Z23 Encounter for immunization: Secondary | ICD-10-CM

## 2019-06-09 NOTE — Progress Notes (Signed)
   Covid-19 Vaccination Clinic  Name:  Carla Beltran    MRN: [id number] DOB: 1965-10-15  06/09/2019  [name] was observed post Covid-19 immunization for 15 minutes without incident. She was provided with Vaccine Information Sheet and instruction to access the V-Safe system.   [name] was instructed to call 911 with any severe reactions post vaccine: Marland Kitchen Difficulty breathing  . Swelling of face and throat  . A fast heartbeat  . A bad rash all over body  . Dizziness and weakness   Immunizations Administered    Name Date Dose VIS Date Route   Pfizer COVID-19 Vaccine 06/09/2019  3:40 PM 0.3 mL 04/13/2018 Intramuscular   Manufacturer: [employer]   Lot: [id number]   [place]: [id number]

## 2019-07-04 ENCOUNTER — Ambulatory Visit: Payer: BC Managed Care – PPO

## 2019-07-16 ENCOUNTER — Ambulatory Visit: Payer: BC Managed Care – PPO | Attending: Internal Medicine

## 2019-07-16 DIAGNOSIS — Z23 Encounter for immunization: Secondary | ICD-10-CM

## 2019-07-16 NOTE — Progress Notes (Signed)
   Covid-19 Vaccination Clinic  Name:  Carla Beltran    MRN: [id number] DOB: [birth year]  07/16/2019  Carla Beltran was observed post Covid-19 immunization for 15 minutes without incident. She was provided with Vaccine Information Sheet and instruction to access the V-Safe system.   Carla Beltran was instructed to call 911 with any severe reactions post vaccine: Marland Kitchen Difficulty breathing  . Swelling of face and throat  . A fast heartbeat  . A bad rash all over body  . Dizziness and weakness   Immunizations Administered    Name Date Dose VIS Date Route   Pfizer COVID-19 Vaccine 07/16/2019 10:47 AM 0.3 mL 04/13/2018 Intramuscular   Manufacturer: [place]   Lot: [id number]   [place]: [id number]

## 2019-07-19 ENCOUNTER — Ambulatory Visit: Payer: BC Managed Care – PPO

## 2019-09-09 ENCOUNTER — Telehealth: Payer: Self-pay

## 2019-09-09 DIAGNOSIS — F410 Panic disorder [episodic paroxysmal anxiety] without agoraphobia: Secondary | ICD-10-CM

## 2019-09-09 DIAGNOSIS — F411 Generalized anxiety disorder: Secondary | ICD-10-CM

## 2019-09-09 NOTE — Telephone Encounter (Signed)
Called patient states that she is having some weight gain and increased fatigue. Patient is currently taking 20mg  daily. Would like instructions on how to stop.

## 2019-09-09 NOTE — Telephone Encounter (Signed)
Patient wants to stop taking Paxil, she does not like the side effects of it. Would like to speak with Dr. Lucita Lora clinical assistant.  Please call [phone]

## 2019-09-09 NOTE — Telephone Encounter (Signed)
She can taper down to half a tab of Paxil daily for 2 weeks. Then half a tab of Paxil every other day for a week.  Then stop.

## 2019-09-09 NOTE — Telephone Encounter (Signed)
Called patient given information and had repeat back to me. Will call if any questions.

## 2019-12-05 ENCOUNTER — Other Ambulatory Visit: Payer: Self-pay | Admitting: Family Medicine

## 2019-12-05 DIAGNOSIS — Z1231 Encounter for screening mammogram for malignant neoplasm of breast: Secondary | ICD-10-CM

## 2019-12-09 MED ORDER — PAROXETINE HCL 10 MG PO TABS: 10.0000 mg | ORAL_TABLET | Freq: Every day | ORAL | 0 refills | Status: DC

## 2019-12-09 NOTE — Telephone Encounter (Signed)
Please advise 

## 2019-12-09 NOTE — Telephone Encounter (Signed)
Patient states she is still slowly weaning off Paxil. Currently taking 10mg  most days. She is requesting new RX for 10mg  tabs. Please call patient to advise.

## 2019-12-09 NOTE — Telephone Encounter (Signed)
LM for pt to returncall

## 2019-12-09 NOTE — Telephone Encounter (Signed)
Patient was last seen by this provider over 6 months ago.  She is due for her 4-month follow-up.  I did go ahead and call in a 30-day prescription of her medication to [facility].  If she is weaned off at that time, then she does not need to follow-up for that medication.  If she is requiring additional refills she will need to follow-up prior to running out of her 30-day prescription.  If she asks for advice on how to wean off completely- please see the phone note thread which explained this 3 months ago.  Thanks

## 2019-12-09 NOTE — Addendum Note (Signed)
Addended by: Howard Pouch A on: 12/09/2019 05:18 PM   Modules accepted: Orders

## 2019-12-12 NOTE — Telephone Encounter (Signed)
Pt was advised of medication and instructions. Pt is aware that she will need an OV for any further refills. Pt was given same instructions for 10 mg as 20 for tapering off.

## 2019-12-22 ENCOUNTER — Telehealth: Payer: Self-pay | Admitting: Family Medicine

## 2019-12-22 ENCOUNTER — Telehealth (INDEPENDENT_AMBULATORY_CARE_PROVIDER_SITE_OTHER): Payer: BC Managed Care – PPO | Admitting: Family Medicine

## 2019-12-22 ENCOUNTER — Encounter: Payer: Self-pay | Admitting: Family Medicine

## 2019-12-22 DIAGNOSIS — R0789 Other chest pain: Secondary | ICD-10-CM

## 2019-12-22 DIAGNOSIS — R059 Cough, unspecified: Secondary | ICD-10-CM | POA: Diagnosis not present

## 2019-12-22 NOTE — Telephone Encounter (Signed)
Called pt to discuss the need of an appointment for further refills. Attempted to sched pt an appointment next week, since pt is being seen by Dr. Maudie Mercury for cough hs. Pt was instructed to call office if appointment needed to be changed to a virtual due to her cough. Pt was confused on why she would need to quarantine for a cough. I informed pt that I cannot evaluate you or give that information, I was simply asking for communication so I can better assist you and your needs at that time. Pt was then asking if she can decline any recommendations the provider gives. I informed pt the recommendations will remain but has the right to decline. Pt was then offered to speak to another another staff to help but communicate. I also apologized to pt for not being able to properly communicate. Pt states she "process externally" and she understands everything she just wanted to make sure she was. I informed pt that it would be best if we schedule her appointment after she see Dr. Maudie Mercury, to prevent any confusion for the future. Pt and I apologized and disconnected the phone.

## 2019-12-22 NOTE — Telephone Encounter (Signed)
See other encounter.

## 2019-12-22 NOTE — Telephone Encounter (Signed)
Patient called to let Adolm Joseph know the following details from her virtual visit today: Dr. Maudie Mercury gave her two options, either an RX for antibiotic or go to UC for xray. Patient would prefer the xray option and would like a call back to discuss.

## 2019-12-22 NOTE — Telephone Encounter (Signed)
Carla Beltran  12/22/19 9:07 AM Note Patient has cough/congestion and is scheduled for VV with Dr. Maudie Mercury today. She states she has been tapering Paxil and needs refill. Patient

## 2019-12-22 NOTE — Telephone Encounter (Signed)
Patient has cough/congestion and is scheduled for VV with Dr. Maudie Mercury today. She states she has been tapering Paxil and needs refill. Patient would like call to discuss before refill is sent.

## 2019-12-22 NOTE — Progress Notes (Signed)
Virtual Visit via Video Note  I connected with Carla Beltran  on 12/22/19 at 12:20 PM EDT by a video enabled telemedicine application and verified that I am speaking with the correct person using two identifiers.  Location patient: home, [place] Location provider:work or home office Persons participating in the virtual visit: patient, provider  I discussed the limitations of evaluation and management by telemedicine and the availability of in person appointments. The patient expressed understanding and agreed to proceed.   HPI:  Acute telemedicine visit for cough: -Onset: with a cold a week about 3 weeks ago  -Symptoms include: lingering cough that is sometimes productive and also a little discomfort in R lower rib cage - she thought was maybe a pulled muscle, a little more tired than usual -She is worried about possible pneumonia versus a pulled muscle versus other and wants a definitive diagnosis -Denies: fevers, sob, wheezing, malaise, hemoptysis -Pertinent past medical history:allergy -Pertinent medication allergies: codiene -COVID-19 vaccine status: fully vaccinated  ROS: See pertinent positives and negatives per HPI.  Past Medical History:  Diagnosis Date  . Allergy   . Anemia   . Anxiety   . Depression   . Fatigue   . Frequent UTI   . Hyperlipidemia   . Migraine   . Reactive airway disease    patient unaware RAD  . Renal stones   . Rosacea   . Vertigo    was on antivert    Past Surgical History:  Procedure Laterality Date  . BREAST BIOPSY  1990   benign  . BREAST EXCISIONAL BIOPSY Right 30+ yrs ago   benign  . LASIK  2001  . TONSILLECTOMY AND ADENOIDECTOMY  1985  . WISDOM TOOTH EXTRACTION       Current Outpatient Medications:  .  Lactobacillus (PROBIOTIC ACIDOPHILUS PO), Take 1 capsule by mouth daily., Disp: , Rfl:  .  nitrofurantoin, macrocrystal-monohydrate, (MACROBID) 100 MG capsule, TAKE 1 CAPSULE BY MOUTH AS  NEEDED AFTER INTERCOURSE TO PREVENT UTI, Disp: 90  capsule, Rfl: 1 .  Omega-3 Fatty Acids (FISH OIL) 1000 MG CAPS, Take 2 capsules by mouth daily. 2150mg  daily, Disp: , Rfl:  .  OVER THE COUNTER MEDICATION, Herbal supplements for brain and adrenal support, Disp: , Rfl:  .  PARoxetine (PAXIL) 10 MG tablet, Take 1 tablet (10 mg total) by mouth daily. Patient needs an appointment for any refills. (Patient taking differently: Take 5 mg by mouth daily. Patient needs an appointment for any refills.), Disp: 30 tablet, Rfl: 0 .  VITAMIN D PO, Take by mouth daily., Disp: , Rfl:   Current Facility-Administered Medications:  .  0.9 %  sodium chloride infusion, 500 mL, Intravenous, Once, Danis, Kirke Corin, MD  EXAM:  VITALS per patient if applicable:  GENERAL: alert, oriented, appears well and in no acute distress  HEENT: atraumatic, conjunttiva clear, no obvious abnormalities on inspection of external nose and ears  NECK: normal movements of the head and neck  LUNGS: on inspection no signs of respiratory distress, breathing rate appears normal, no obvious gross SOB, gasping or wheezing  CV: no obvious cyanosis  MS: moves all visible extremities without noticeable abnormality  PSYCH/NEURO: pleasant and cooperative, no obvious depression or anxiety, speech and thought processing grossly intact  ASSESSMENT AND PLAN:  Discussed the following assessment and plan:  Cough  Chest wall pain  -we discussed possible serious and likely etiologies, options for evaluation and workup, limitations of telemedicine visit vs in person visit, treatment, treatment risks and  precautions. Pt prefers to treat via telemedicine empirically rather than in person at this moment.  She thinks the pain in the chest is reproducible on self exam.  However, she really wants to get to the bottom of this and wants a definitive diagnosis.  Discussed possible residual cough from cold versus pneumonia versus other.  After lengthy discussion, she has opted for referral to in  person care and prefers to go today as an in person pulmonary and chest wall exam and possible chest x-ray would help to give her a more definitive diagnosis.  She prefers to avoid antibiotics unless there is an antibiotic on a chest x-ray.  Discussed options for in person care, as she reports her primary care office cannot see her in person due to the symptoms and the Covid pandemic.    Advised to seek prompt in person care if worsening, new symptoms arise, or if is not improving with treatment. Discussed options for inperson care if PCP office not available. Did let this patient know that I only do telemedicine on Tuesdays and Thursdays for [place]. Advised to schedule follow up visit with PCP or [facility] if any further questions or concerns to avoid delays in care.   I discussed the assessment and treatment plan with the patient.  Spent about 20 minutes on this appointment.  The patient was provided an opportunity to ask questions and all were answered. The patient agreed with the plan and demonstrated an understanding of the instructions.     Carla Kern, DO

## 2019-12-22 NOTE — Telephone Encounter (Signed)
Pt was advise to follow Dr. Maudie Mercury recommendations. Pt will cb to sched appt with Dr. Raoul Pitch for Paxil refill.    FYI

## 2019-12-27 ENCOUNTER — Other Ambulatory Visit: Payer: Self-pay

## 2019-12-27 ENCOUNTER — Ambulatory Visit
Admission: RE | Admit: 2019-12-27 | Discharge: 2019-12-27 | Disposition: A | Discharge status: Home / Self Care | Payer: BC Managed Care – PPO | Source: Ambulatory Visit | Attending: Family Medicine | Admitting: Family Medicine

## 2019-12-27 DIAGNOSIS — Z1231 Encounter for screening mammogram for malignant neoplasm of breast: Secondary | ICD-10-CM

## 2020-01-04 ENCOUNTER — Encounter: Payer: Self-pay | Admitting: Family Medicine

## 2020-01-04 ENCOUNTER — Ambulatory Visit (INDEPENDENT_AMBULATORY_CARE_PROVIDER_SITE_OTHER): Payer: BC Managed Care – PPO | Admitting: Family Medicine

## 2020-01-04 ENCOUNTER — Other Ambulatory Visit: Payer: Self-pay

## 2020-01-04 VITALS — BP 137/73 | HR 94 | Temp 98.2°F | Ht 65.0 in | Wt 160.0 lb

## 2020-01-04 DIAGNOSIS — F410 Panic disorder [episodic paroxysmal anxiety] without agoraphobia: Secondary | ICD-10-CM

## 2020-01-04 DIAGNOSIS — F411 Generalized anxiety disorder: Secondary | ICD-10-CM

## 2020-01-04 MED ORDER — PAROXETINE HCL 10 MG PO TABS: 5.0000 mg | ORAL_TABLET | Freq: Every day | ORAL | 1 refills | Status: DC

## 2020-01-04 NOTE — Progress Notes (Signed)
Patient ID: Carla Beltran, female  DOB: 22-Oct-1965, 54 y.o.   MRN: [id number] Patient Care Team    Relationship Specialty Notifications Start End  [name], DO PCP - General Family Medicine  05/28/16   Doran Stabler, MD Consulting Physician Gastroenterology  03/13/17     Chief Complaint  Patient presents with  . Follow-up    [facility]; pt is not fasting    Subjective:  Carla Beltran is a 54 y.o.  Female  present for [name]:  Patient reports her medication is ok. She is try to taper off medication all together. She is using OTC supplements and feeling well.   Original note on establishment: Patient presents for establishment today and admits to increased anxiety and panic attacks. She has fears that she has trouble controlling. She worries about everything. She has a strong fear of driving [place] the [place], especially in areas she is unfamiliar with. She endorses panic attacks that occur during driving and at other times. She states she experiences a increased heart rate and then feeling like she is "out of her body ". She reports increased anxiety in her life especially around times when she is moving, such as currently. She states she's always had anxiety. She reports she did not have a great childhood. Her mother now has dementia and is in [place] in a nursing facility, this also caused her increased anxiety. Her mother with hoarder. She has 2 daughters, which also are affected by anxiety and are on Celexa. She states her daughters are not affected by his much panic she is. She reports being on Zoloft since 2006. She was initially on 25 mg of Zoloft daily, and eventually increased up to Zoloft 50 mg daily. Prior to moving in August 2017 her prior provider told her to increase to 75 mg daily. She states that she did not like the apathetic side effects she experienced at 75 mg dose, so she decreased back down to the 50 mg dose daily. She reports being seen by  counselor many times in the past. She has seek the help of a Marketing executive in [place] Leanor Rubenstein). She has yet to establish with her.   Depression screen [facility] 2/9 01/04/2020 05/24/2019 01/20/2019 09/15/2018 03/16/2018  Decreased Interest 0 0 0 0 0  Down, Depressed, Hopeless 0 0 0 0 0  PHQ - 2 Score 0 0 0 0 0  Altered sleeping 0 - - - 0  Tired, decreased energy 0 - - - 0  Change in appetite 0 - - - 0  Feeling bad or failure about yourself  0 - - - 0  Trouble concentrating 1 - - - 0  Moving slowly or fidgety/restless 0 - - - 0  Suicidal thoughts 0 - - - 0  PHQ-9 Score 1 - - - 0  Difficult doing work/chores - - - - Not difficult at all   GAD 7 : Generalized Anxiety Score 01/04/2020 05/24/2019 01/20/2019 09/15/2018  Nervous, Anxious, on Edge 0 0 0 0  Control/stop worrying 0 0 0 0  Worry too much - different things 0 0 0 0  Trouble relaxing 0 0 0 0  Restless 0 0 0 0  Easily annoyed or irritable 0 0 0 0  Afraid - awful might happen 0 0 0 0  Total GAD 7 Score 0 0 0 0  Anxiety Difficulty - Not difficult at all Not difficult at all Not difficult at all  Immunization History  Administered Date(s) Administered  . PFIZER SARS-COV-2 Vaccination 06/09/2019, 07/16/2019  . Tdap 10/26/2012  . Zoster Recombinat (Shingrix) 09/16/2017, 11/26/2017     Past Medical History:  Diagnosis Date  . Allergy   . Anemia   . Anxiety   . Depression   . Fatigue   . Frequent UTI   . Hyperlipidemia   . Migraine   . Reactive airway disease    patient unaware RAD  . Renal stones   . Rosacea   . Vertigo    was on antivert   Allergies  Allergen Reactions  . Codeine    Past Surgical History:  Procedure Laterality Date  . BREAST BIOPSY  1990   benign  . BREAST EXCISIONAL BIOPSY Right 30+ yrs ago   benign  . LASIK  2001  . TONSILLECTOMY AND ADENOIDECTOMY  1985  . WISDOM TOOTH EXTRACTION     Family History  Problem Relation Age of Onset  . Diabetes Mother   . Mental illness Mother         Hoarder  . Dementia Mother   . Asthma Mother   . Depression Mother   . Migraines Mother   . Atrial fibrillation Father   . Hypertension Father   . Diabetes Maternal Aunt   . Hearing loss Maternal Aunt   . Stroke Maternal Grandmother   . Arthritis Maternal Grandfather   . Heart disease Maternal Grandfather   . Colon polyps Maternal Grandfather   . Colon polyps Paternal Grandfather   . Colon cancer Neg Hx   . Esophageal cancer Neg Hx   . Rectal cancer Neg Hx   . Stomach cancer Neg Hx    Social History   Social History Narrative   Married to [place]. Two adult children [name] and [name] (in college).    Moved from [place] [facility]  area) 09/2015.    SAHM. College educated.    Drinks one cup of coffee a day.   Takes a daily vitamin.   Wears seatbelts, bicycle helmet and smoke detector in the home.   Tries to exercise routinely.   Feels safe in her relationships.    Allergies as of 01/04/2020      Reactions   Codeine       Medication List       Accurate as of January 04, 2020 10:01 AM. If you have any questions, ask your nurse or doctor.        STOP taking these medications   nitrofurantoin (macrocrystal-monohydrate) 100 MG capsule Commonly known as: MACROBID Stopped by: Howard Pouch, DO     TAKE these medications   Fish Oil 1000 MG Caps Take 2 capsules by mouth daily. 4300mg  daily   GABA IJ Inject as directed.   OVER THE COUNTER MEDICATION Herbal supplements for brain and adrenal support   OVER THE COUNTER MEDICATION Stress care vitamin   OVER THE COUNTER MEDICATION Lean care   PARoxetine 10 MG tablet Commonly known as: PAXIL Take 0.5 tablets (5 mg total) by mouth daily. Patient needs an appointment for any refills.   PROBIOTIC ACIDOPHILUS PO Take 1 capsule by mouth daily.   Serotonin HCl Powd by Does not apply route.   VITAMIN D PO Take 10,000 Units by mouth daily.   VITAMIN K PO Take by mouth.       All past medical history,  surgical history, allergies, family history, immunizations andmedications were updated in the EMR today and reviewed under the history and medication portions  of their EMR.     ROS: 14 pt review of systems performed and negative (unless mentioned in an HPI)  Objective: BP 137/73   Pulse 94   Temp 98.2 F (36.8 C) (Oral)   Ht 5\' 5"  (1.651 m)   Wt 160 lb (72.6 kg)   LMP 12/31/2014   SpO2 99%   BMI 26.63 kg/m  Gen: Afebrile. No acute distress.  HENT: AT. [place].  Eyes:Pupils Equal Round Reactive to light, Extraocular movements intact,  Conjunctiva without redness, discharge or icterus. CV: RRR  Neuro: Normal gait. PERLA. EOMi. Alert. Oriented x3  Psych: Normal affect, dress and demeanor. Normal speech. Normal thought content and judgment.   No exam data present  Assessment/plan: ARON NEEDLES is a 54 y.o. female present for [facility] Generalized anxiety disorder/panic attacks - stable.  - paxil 5 g QD tapering at her own pace.  - Recommended light box therapy in the winter.  - continue Marketing executive. - F/U every 5.5 months if needing refills, unless needed sooner   Meds ordered this encounter  Medications  . PARoxetine (PAXIL) 10 MG tablet    Sig: Take 0.5 tablets (5 mg total) by mouth daily. Patient needs an appointment for any refills.    Dispense:  45 tablet    Refill:  1   Referral Orders  No referral(s) requested today    This visit occurred during the SARS-CoV-2 public health emergency.  Safety protocols were in place, including screening questions prior to the visit, additional usage of staff PPE, and extensive cleaning of exam room while observing appropriate contact time as indicated for disinfecting solutions.    Electronically signed by: Howard Pouch, DO [place]

## 2020-01-04 NOTE — Patient Instructions (Signed)
Great to see you today.  I am so glad you are feeling so well.   I have refilled your med as we discussed.

## 2020-02-12 DIAGNOSIS — Z20828 Contact with and (suspected) exposure to other viral communicable diseases: Secondary | ICD-10-CM | POA: Diagnosis not present

## 2020-02-25 DIAGNOSIS — Z03818 Encounter for observation for suspected exposure to other biological agents ruled out: Secondary | ICD-10-CM | POA: Diagnosis not present

## 2020-06-04 ENCOUNTER — Telehealth: Payer: Self-pay

## 2020-06-04 NOTE — Telephone Encounter (Signed)
Pt called stating that her anxiety and depression is getting bad and she would like to start her paxil again. Pt is aware that PCP is out of the office until Monday. Pt stated that she still has medication in 2 different doses and wants/needs to start med now. Pt does not want to see other provider at this time because she has the medication at home. I advised pt to take lowest dose of medication (5 mg) because she wants restart medication prior to appt. Pt scheduled to come in office on 04/27. Advised pt to contact therapist also and to go to ED if experiencing suicidal thoughts. I also advised pt to use friends and family (support system) to talk with for support if she could. Pt asked when she should up the dose of medication and was advised not to up dosage without seeing PCP.

## 2020-06-07 NOTE — Telephone Encounter (Signed)
Followed up with pt and she states she is doing better. She just does not understand why all of a sudden she is having issues with her depression. Is not having any immediate issues. Her eating has changed but she is staying hydrated.

## 2020-06-12 ENCOUNTER — Encounter: Payer: Self-pay | Admitting: Family Medicine

## 2020-06-12 ENCOUNTER — Ambulatory Visit (INDEPENDENT_AMBULATORY_CARE_PROVIDER_SITE_OTHER): Payer: BC Managed Care – PPO | Admitting: Family Medicine

## 2020-06-12 ENCOUNTER — Other Ambulatory Visit: Payer: Self-pay

## 2020-06-12 VITALS — BP 115/72 | HR 84 | Temp 98.6°F | Ht 65.0 in | Wt 150.0 lb

## 2020-06-12 DIAGNOSIS — F411 Generalized anxiety disorder: Secondary | ICD-10-CM

## 2020-06-12 DIAGNOSIS — R5383 Other fatigue: Secondary | ICD-10-CM | POA: Insufficient documentation

## 2020-06-12 DIAGNOSIS — R599 Enlarged lymph nodes, unspecified: Secondary | ICD-10-CM

## 2020-06-12 DIAGNOSIS — F410 Panic disorder [episodic paroxysmal anxiety] without agoraphobia: Secondary | ICD-10-CM | POA: Diagnosis not present

## 2020-06-12 DIAGNOSIS — F320 Major depressive disorder, single episode, mild: Secondary | ICD-10-CM

## 2020-06-12 DIAGNOSIS — F330 Major depressive disorder, recurrent, mild: Secondary | ICD-10-CM | POA: Insufficient documentation

## 2020-06-12 DIAGNOSIS — J45909 Unspecified asthma, uncomplicated: Secondary | ICD-10-CM | POA: Insufficient documentation

## 2020-06-12 DIAGNOSIS — F329 Major depressive disorder, single episode, unspecified: Secondary | ICD-10-CM | POA: Insufficient documentation

## 2020-06-12 DIAGNOSIS — E559 Vitamin D deficiency, unspecified: Secondary | ICD-10-CM | POA: Insufficient documentation

## 2020-06-12 DIAGNOSIS — R42 Dizziness and giddiness: Secondary | ICD-10-CM | POA: Insufficient documentation

## 2020-06-12 DIAGNOSIS — G43009 Migraine without aura, not intractable, without status migrainosus: Secondary | ICD-10-CM | POA: Insufficient documentation

## 2020-06-12 DIAGNOSIS — N390 Urinary tract infection, site not specified: Secondary | ICD-10-CM | POA: Insufficient documentation

## 2020-06-12 DIAGNOSIS — H9209 Otalgia, unspecified ear: Secondary | ICD-10-CM | POA: Insufficient documentation

## 2020-06-12 DIAGNOSIS — L719 Rosacea, unspecified: Secondary | ICD-10-CM | POA: Insufficient documentation

## 2020-06-12 DIAGNOSIS — N6019 Diffuse cystic mastopathy of unspecified breast: Secondary | ICD-10-CM | POA: Insufficient documentation

## 2020-06-12 DIAGNOSIS — R5381 Other malaise: Secondary | ICD-10-CM | POA: Insufficient documentation

## 2020-06-12 HISTORY — DX: Enlarged lymph nodes, unspecified: R59.9

## 2020-06-12 MED ORDER — ESCITALOPRAM OXALATE 10 MG PO TABS: 10.0000 mg | ORAL_TABLET | Freq: Every day | ORAL | 1 refills | Status: DC

## 2020-06-12 NOTE — Patient Instructions (Addendum)
Follow up in 6 weeks.   I believe you will feel well on the Lexapro.   http://APA.org/depression-guideline"> https://clinicalkey.com"> http://point-of-care.elsevierperformancemanager.com/skills/"> http://point-of-care.elsevierperformancemanager.com">   Managing Depression, Adult Depression is a mental health condition that affects your thoughts, feelings, and actions. Being diagnosed with depression can bring you relief if you did not know why you have felt or behaved a certain way. It could also leave you feeling overwhelmed with uncertainty about your future. Preparing yourself to manage your symptoms can help you feel more positive about your future. How to manage lifestyle changes Managing stress Stress is your body's reaction to life changes and events, both good and bad. Stress can add to your feelings of depression. Learning to manage your stress can help lessen your feelings of depression. Try some of the following approaches to reducing your stress (stress reduction techniques):  Listen to music that you enjoy and that inspires you.  Try using a meditation app or take a meditation class.  Develop a practice that helps you connect with your spiritual self. Walk in nature, pray, or go to a place of worship.  Do some deep breathing. To do this, inhale slowly through your nose. Pause at the top of your inhale for a few seconds and then exhale slowly, letting your muscles relax.  Practice yoga to help relax and work your muscles. Choose a stress reduction technique that suits your lifestyle and personality. These techniques take time and practice to develop. Set aside 5-15 minutes a day to do them. Therapists can offer training in these techniques. Other things you can do to manage stress include:  Keeping a stress diary.  Knowing your limits and saying no when you think something is too much.  Paying attention to how you react to certain situations. You may not be able to control  everything, but you can change your reaction.  Adding humor to your life by watching funny films or TV shows.  Making time for activities that you enjoy and that relax you.   Medicines Medicines, such as antidepressants, are often a part of treatment for depression.  Talk with your pharmacist or health care provider about all the medicines, supplements, and herbal products that you take, their possible side effects, and what medicines and other products are safe to take together.  Make sure to report any side effects you may have to your health care provider. Relationships Your health care provider may suggest family therapy, couples therapy, or individual therapy as part of your treatment. How to recognize changes Everyone responds differently to treatment for depression. As you recover from depression, you may start to:  Have more interest in doing activities.  Feel less hopeless.  Have more energy.  Overeat less often, or have a better appetite.  Have better mental focus. It is important to recognize if your depression is not getting better or is getting worse. The symptoms you had in the beginning may return, such as:  Tiredness (fatigue) or low energy.  Eating too much or too little.  Sleeping too much or too little.  Feeling restless, agitated, or hopeless.  Trouble focusing or making decisions.  Unexplained physical complaints.  Feeling irritable, angry, or aggressive. If you or your family members notice these symptoms coming back, let your health care provider know right away. Follow these instructions at home: Activity  Try to get some form of exercise each day, such as walking, biking, swimming, or lifting weights.  Practice stress reduction techniques.  Engage your mind by taking  a class or doing some volunteer work.   Lifestyle  Get the right amount and quality of sleep.  Cut down on using caffeine, tobacco, alcohol, and other potentially harmful  substances.  Eat a healthy diet that includes plenty of vegetables, fruits, whole grains, low-fat dairy products, and lean protein. Do not eat a lot of foods that are high in solid fats, added sugars, or salt (sodium). General instructions  Take over-the-counter and prescription medicines only as told by your health care provider.  Keep all follow-up visits as told by your health care provider. This is important. Where to find support Talking to others Friends and family members can be sources of support and guidance. Talk to trusted friends or family members about your condition. Explain your symptoms to them, and let them know that you are working with a health care provider to treat your depression. Tell friends and family members how they also can be helpful.   Finances  Find appropriate mental health providers that fit with your financial situation.  Talk with your health care provider about options to get reduced prices on your medicines. Where to find more information You can find support in your area from:  Anxiety and Depression Association of America (ADAA): www.adaa.org  Mental Health America: www.mentalhealthamerica.net  [employer] on Mental Illness: www.nami.org Contact a health care provider if:  You stop taking your antidepressant medicines, and you have any of these symptoms: ? Nausea. ? Headache. ? Light-headedness. ? Chills and body aches. ? Not being able to sleep (insomnia).  You or your friends and family think your depression is getting worse. Get help right away if:  You have thoughts of hurting yourself or others. If you ever feel like you may hurt yourself or others, or have thoughts about taking your own life, get help right away. Go to your nearest emergency department or:  Call your local emergency services (911 in the [place]  Call a suicide crisis helpline, such as the [place] at (717)244-9600. This is open 24  hours a day in the [place]  Text the Crisis Text Line at (775)407-1783 (in the [place].). Summary  If you are diagnosed with depression, preparing yourself to manage your symptoms is a good way to feel positive about your future.  Work with your health care provider on a management plan that includes stress reduction techniques, medicines (if applicable), therapy, and healthy lifestyle habits.  Keep talking with your health care provider about how your treatment is working.  If you have thoughts about taking your own life, call a suicide crisis helpline or text a crisis text line. This information is not intended to replace advice given to you by your health care provider. Make sure you discuss any questions you have with your health care provider. Document Revised: 12/15/2018 Document Reviewed: 12/15/2018 Elsevier Patient Education  2021 [place].   http://NIMH.NIH.Gov">  Generalized Anxiety Disorder, Adult Generalized anxiety disorder (GAD) is a mental health condition. Unlike normal worries, anxiety related to GAD is not triggered by a specific event. These worries do not fade or get better with time. GAD interferes with relationships, work, and school. GAD symptoms can vary from mild to severe. People with severe GAD can have intense waves of anxiety with physical symptoms that are similar to panic attacks. What are the causes? The exact cause of GAD is not known, but the following are believed to have an impact:  Differences in natural brain chemicals.  Genes passed down  from parents to children.  Differences in the way threats are perceived.  Development during childhood.  Personality. What increases the risk? The following factors may make you more likely to develop this condition:  Being female.  Having a family history of anxiety disorders.  Being very shy.  Experiencing very stressful life events, such as the death of a loved one.  Having a very stressful family  environment. What are the signs or symptoms? People with GAD often worry excessively about many things in their lives, such as their health and family. Symptoms may also include:  Mental and emotional symptoms: ? Worrying excessively about natural disasters. ? Fear of being late. ? Difficulty concentrating. ? Fears that others are judging your performance.  Physical symptoms: ? Fatigue. ? Headaches, muscle tension, muscle twitches, trembling, or feeling shaky. ? Feeling like your heart is pounding or beating very fast. ? Feeling out of breath or like you cannot take a deep breath. ? Having trouble falling asleep or staying asleep, or experiencing restlessness. ? Sweating. ? Nausea, diarrhea, or irritable bowel syndrome (IBS).  Behavioral symptoms: ? Experiencing erratic moods or irritability. ? Avoidance of new situations. ? Avoidance of people. ? Extreme difficulty making decisions. How is this diagnosed? This condition is diagnosed based on your symptoms and medical history. You will also have a physical exam. Your health care provider may perform tests to rule out other possible causes of your symptoms. To be diagnosed with GAD, a person must have anxiety that:  Is out of his or her control.  Affects several different aspects of his or her life, such as work and relationships.  Causes distress that makes him or her unable to take part in normal activities.  Includes at least three symptoms of GAD, such as restlessness, fatigue, trouble concentrating, irritability, muscle tension, or sleep problems. Before your health care provider can confirm a diagnosis of GAD, these symptoms must be present more days than they are not, and they must last for 6 months or longer. How is this treated? This condition may be treated with:  Medicine. Antidepressant medicine is usually prescribed for long-term daily control. Anti-anxiety medicines may be added in severe cases, especially when  panic attacks occur.  Talk therapy (psychotherapy). Certain types of talk therapy can be helpful in treating GAD by providing support, education, and guidance. Options include: ? Cognitive behavioral therapy (CBT). People learn coping skills and self-calming techniques to ease their physical symptoms. They learn to identify unrealistic thoughts and behaviors and to replace them with more appropriate thoughts and behaviors. ? Acceptance and commitment therapy (ACT). This treatment teaches people how to be mindful as a way to cope with unwanted thoughts and feelings. ? Biofeedback. This process trains you to manage your body's response (physiological response) through breathing techniques and relaxation methods. You will work with a therapist while machines are used to monitor your physical symptoms.  Stress management techniques. These include yoga, meditation, and exercise. A mental health specialist can help determine which treatment is best for you. Some people see improvement with one type of therapy. However, other people require a combination of therapies.   Follow these instructions at home: Lifestyle  Maintain a consistent routine and schedule.  Anticipate stressful situations. Create a plan, and allow extra time to work with your plan.  Practice stress management or self-calming techniques that you have learned from your therapist or your health care provider. General instructions  Take over-the-counter and prescription medicines only as told by  your health care provider.  Understand that you are likely to have setbacks. Accept this and be kind to yourself as you persist to take better care of yourself.  Recognize and accept your accomplishments, even if you judge them as small.  Keep all follow-up visits as told by your health care provider. This is important. Contact a health care provider if:  Your symptoms do not get better.  Your symptoms get worse.  You have signs of  depression, such as: ? A persistently sad or irritable mood. ? Loss of enjoyment in activities that used to bring you joy. ? Change in weight or eating. ? Changes in sleeping habits. ? Avoiding friends or family members. ? Loss of energy for normal tasks. ? Feelings of guilt or worthlessness. Get help right away if:  You have serious thoughts about hurting yourself or others. If you ever feel like you may hurt yourself or others, or have thoughts about taking your own life, get help right away. Go to your nearest emergency department or:  Call your local emergency services (911 in the [place]  Call a suicide crisis helpline, such as the [place] at 406-772-1758. This is open 24 hours a day in the [place]  Text the Crisis Text Line at (704)671-0796 (in the [place].). Summary  Generalized anxiety disorder (GAD) is a mental health condition that involves worry that is not triggered by a specific event.  People with GAD often worry excessively about many things in their lives, such as their health and family.  GAD may cause symptoms such as restlessness, trouble concentrating, sleep problems, frequent sweating, nausea, diarrhea, headaches, and trembling or muscle twitching.  A mental health specialist can help determine which treatment is best for you. Some people see improvement with one type of therapy. However, other people require a combination of therapies. This information is not intended to replace advice given to you by your health care provider. Make sure you discuss any questions you have with your health care provider. Document Revised: 11/24/2018 Document Reviewed: 11/24/2018 Elsevier Patient Education  [place].

## 2020-06-12 NOTE — Progress Notes (Signed)
Patient ID: Carla Beltran, female  DOB: 1965-08-22, 55 y.o.   MRN: [id number] Patient Care Team    Relationship Specialty Notifications Start End  Carla Hillock, DO PCP - General Family Medicine  05/28/16   Carla Stabler, MD Consulting Physician Gastroenterology  03/13/17     Chief Complaint  Patient presents with  . Follow-up  . Depression    Subjective:  Carla Beltran is a 55 y.o.  Female  present for [facility] Anxiety/phobia:  Returns today and states that she is having increased anxiety along with some depression symptoms.  She has desired to attempt to discontinue Paxil at the end of last year.  She states she was able to start Paxil 5 mg (after tapering down) on December 1.  She reports January was difficult with deaths in the family and other family stressors.  Since that time she feels like she is spiraling and believes she should get back on her medication.  Had been taking an over-the-counter supplement to help with her anxiety during December and January, but does not feel that its been helpful.  She restarted her Paxil 5 mg on 06/05/2020.  She did not feel that dose was effective and increased to 10 mg on 06/07/2020.  Her prior dose prior to tapering off the medication was Paxil 20 mg daily.  She has reported she does not care for the fatigue and decreased libido associated with the Paxil but feels she would benefit from it from her mental health standpoint. 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  She is continuing to see her Marketing executive. Original note on establishment: Patient presents for establishment today and admits to increased anxiety and panic attacks. She has fears that she has trouble controlling. She worries about everything. She has a strong fear of driving [place] the [place], especially in areas she is unfamiliar with. She endorses panic attacks that occur during driving and at  other times. She states she experiences a increased heart rate and then feeling like she is "out of her body ". She reports increased anxiety in her life especially around times when she is moving, such as currently. She states she's always had anxiety. She reports she did not have a great childhood. Her mother now has dementia and is in [place] in a nursing facility, this also caused her increased anxiety. Her mother with hoarder. She has 2 daughters, which also are affected by anxiety and are on Celexa. She states her daughters are not affected by his much panic she is. She reports being on Zoloft since 2006. She was initially on 25 mg of Zoloft daily, and eventually increased up to Zoloft 50 mg daily. Prior to moving in August 2017 her prior provider told her to increase to 75 mg daily. She states that she did not like the apathetic side effects she experienced at 75 mg dose, so she decreased back down to the 50 mg dose daily. She reports being seen by counselor many times in the past. She has seek the help of a Marketing executive in [place] Leanor Rubenstein). She has yet to establish with her.   Depression screen [facility] 2/9 06/12/2020 01/04/2020 05/24/2019 01/20/2019 09/15/2018  Decreased Interest 3 0 0 0 0  Down, Depressed, Hopeless 3 0 0 0 0  PHQ - 2 Score 6 0 0 0 0  Altered sleeping 1  0 - - -  Tired, decreased energy 2 0 - - -  Change in appetite 3 0 - - -  Feeling bad or failure about yourself  2 0 - - -  Trouble concentrating 1 1 - - -  Moving slowly or fidgety/restless 2 0 - - -  Suicidal thoughts 0 0 - - -  PHQ-9 Score 17 1 - - -  Difficult doing work/chores - - - - -   GAD 7 : Generalized Anxiety Score 06/12/2020 01/04/2020 05/24/2019 01/20/2019  Nervous, Anxious, on Edge 3 0 0 0  Control/stop worrying 3 0 0 0  Worry too much - different things 3 0 0 0  Trouble relaxing 3 0 0 0  Restless 3 0 0 0  Easily annoyed or irritable 1 0 0 0  Afraid - awful might happen 1 0 0 0  Total GAD 7  Score 17 0 0 0  Anxiety Difficulty - - Not difficult at all Not difficult at all    Immunization History  Administered Date(s) Administered  . PFIZER(Purple Top)SARS-COV-2 Vaccination 06/09/2019, 07/16/2019  . Tdap 10/26/2012  . Zoster Recombinat (Shingrix) 09/16/2017, 11/26/2017     Past Medical History:  Diagnosis Date  . Allergy   . Anemia   . Anxiety   . Depression   . Fatigue   . Frequent UTI   . Hyperlipidemia   . Migraine   . Reactive airway disease    patient unaware RAD  . Renal stones   . Rosacea   . Vertigo    was on antivert   Allergies  Allergen Reactions  . Codeine    Past Surgical History:  Procedure Laterality Date  . BREAST BIOPSY  1990   benign  . BREAST EXCISIONAL BIOPSY Right 30+ yrs ago   benign  . LASIK  2001  . TONSILLECTOMY AND ADENOIDECTOMY  1985  . WISDOM TOOTH EXTRACTION     Family History  Problem Relation Age of Onset  . Diabetes Mother   . Mental illness Mother        Hoarder  . Dementia Mother   . Asthma Mother   . Depression Mother   . Migraines Mother   . Atrial fibrillation Father   . Hypertension Father   . Diabetes Maternal Aunt   . Hearing loss Maternal Aunt   . Stroke Maternal Grandmother   . Arthritis Maternal Grandfather   . Heart disease Maternal Grandfather   . Colon polyps Maternal Grandfather   . Colon polyps Paternal Grandfather   . Colon cancer Neg Hx   . Esophageal cancer Neg Hx   . Rectal cancer Neg Hx   . Stomach cancer Neg Hx    Social History   Social History Narrative   Married to [place]. Two adult children Carla Beltran and Carla Beltran (in college).    Moved from [place] [facility]  area) 09/2015.    SAHM. College educated.    Drinks one cup of coffee a day.   Takes a daily vitamin.   Wears seatbelts, bicycle helmet and smoke detector in the home.   Tries to exercise routinely.   Feels safe in her relationships.    Allergies as of 06/12/2020      Reactions   Codeine       Medication List        Accurate as of June 12, 2020  6:19 PM. If you have any questions, ask your nurse or doctor.        STOP  taking these medications   GABA IJ Stopped by: Howard Pouch, DO   Serotonin HCl Powd Stopped by: Howard Pouch, DO     TAKE these medications   b complex vitamins capsule Take 1 capsule by mouth daily.   escitalopram 10 MG tablet Commonly known as: Lexapro Take 1 tablet (10 mg total) by mouth daily. Started by: Howard Pouch, DO   Fish Oil 1000 MG Caps Take 2 capsules by mouth daily. 4300mg  daily   MAGNESIUM PO Take 1,077 mg by mouth.   OVER THE COUNTER MEDICATION Herbal supplements for brain and adrenal support   OVER THE COUNTER MEDICATION Stress care vitamin   OVER THE COUNTER MEDICATION Lean care   OVER THE COUNTER MEDICATION Lauricidin   PARoxetine 10 MG tablet Commonly known as: PAXIL Take 0.5 tablets (5 mg total) by mouth daily. Patient needs an appointment for any refills.   PROBIOTIC ACIDOPHILUS PO Take 1 capsule by mouth daily.   VITAMIN D PO Take 10,000 Units by mouth daily.   VITAMIN K PO Take by mouth.       All past medical history, surgical history, allergies, family history, immunizations andmedications were updated in the EMR today and reviewed under the history and medication portions of their EMR.     ROS: 14 pt review of systems performed and negative (unless mentioned in an HPI)  Objective: BP 115/72   Pulse 84   Temp 98.6 F (37 C) (Oral)   Ht 5\' 5"  (1.651 m)   Wt 150 lb (68 kg)   LMP 12/31/2014   SpO2 100%   BMI 24.96 kg/m  Gen: Afebrile. No acute distress.  HENT: AT. [place].  Eyes:Pupils Equal Round Reactive to light, Extraocular movements intact,  Conjunctiva without redness, discharge or icterus. Psych: Tearful, otherwise normal affect, dress and demeanor. Normal speech. Normal thought content and judgment..    No exam data present  Assessment/plan: TIMIRA BIEDA is a 56 y.o. female present for [facility] Generalized  anxiety disorder/panic attacks/depression P discussion today surrounding her anxiety and depressive symptoms.  It was decided to try Lexapro 10 mg daily with close follow-up.  Hopefully she will have less side effects to Lexapro.  If not would consider restarting Paxil at 20 mg. - continue Marketing executive. - F/U 5 to 6 weeks   Meds ordered this encounter  Medications  . escitalopram (LEXAPRO) 10 MG tablet    Sig: Take 1 tablet (10 mg total) by mouth daily.    Dispense:  90 tablet    Refill:  1   Referral Orders  No referral(s) requested today    This visit occurred during the SARS-CoV-2 public health emergency.  Safety protocols were in place, including screening questions prior to the visit, additional usage of staff PPE, and extensive cleaning of exam room while observing appropriate contact time as indicated for disinfecting solutions.    Electronically signed by: Howard Pouch, DO [place]

## 2020-06-15 ENCOUNTER — Ambulatory Visit: Payer: BC Managed Care – PPO | Admitting: Family Medicine

## 2020-06-15 DIAGNOSIS — F411 Generalized anxiety disorder: Secondary | ICD-10-CM

## 2020-06-15 DIAGNOSIS — F410 Panic disorder [episodic paroxysmal anxiety] without agoraphobia: Secondary | ICD-10-CM

## 2020-07-24 ENCOUNTER — Encounter: Payer: Self-pay | Admitting: Family Medicine

## 2020-07-24 ENCOUNTER — Ambulatory Visit (INDEPENDENT_AMBULATORY_CARE_PROVIDER_SITE_OTHER): Payer: BC Managed Care – PPO | Admitting: Family Medicine

## 2020-07-24 ENCOUNTER — Other Ambulatory Visit: Payer: Self-pay

## 2020-07-24 VITALS — BP 100/63 | HR 92 | Temp 98.3°F | Ht 65.0 in | Wt 142.0 lb

## 2020-07-24 DIAGNOSIS — F411 Generalized anxiety disorder: Secondary | ICD-10-CM | POA: Diagnosis not present

## 2020-07-24 DIAGNOSIS — F410 Panic disorder [episodic paroxysmal anxiety] without agoraphobia: Secondary | ICD-10-CM

## 2020-07-24 DIAGNOSIS — F320 Major depressive disorder, single episode, mild: Secondary | ICD-10-CM | POA: Diagnosis not present

## 2020-07-24 MED ORDER — ESCITALOPRAM OXALATE 20 MG PO TABS: 20.0000 mg | ORAL_TABLET | Freq: Every day | ORAL | 1 refills | Status: DC

## 2020-07-24 NOTE — Progress Notes (Signed)
Patient ID: Carla Beltran, female  DOB: Aug 06, 1965, 55 y.o.   MRN: [id number] Patient Care Team    Relationship Specialty Notifications Start End  [name], DO PCP - General Family Medicine  05/28/16   Doran Stabler, MD Consulting Physician Gastroenterology  03/13/17     Chief Complaint  Patient presents with  . Depression    F/u     Subjective: Carla Beltran is a 55 y.o.  Female  present for [facility] Anxiety/phobia:  Pt reports she feels better on lexapro, but does feel she needs an increase dose for anxiety symptoms.  She brings pics of her beautiful 32 mo old granddaughter today. She is continuing to see her Marketing executive.  Original note on establishment: Patient presents for establishment today and admits to increased anxiety and panic attacks. She has fears that she has trouble controlling. She worries about everything. She has a strong fear of driving on the [place], especially in areas she is unfamiliar with. She endorses panic attacks that occur during driving and at other times. She states she experiences a increased heart rate and then feeling like she is "out of her body ". She reports increased anxiety in her life especially around times when she is moving, such as currently. She states she's always had anxiety. She reports she did not have a great childhood. Her mother now has dementia and is in [place] in a nursing facility, this also caused her increased anxiety. Her mother with hoarder. She has 2 daughters, which also are affected by anxiety and are on Celexa. She states her daughters are not affected by his much panic she is. She reports being on Zoloft since 2006. She was initially on 25 mg of Zoloft daily, and eventually increased up to Zoloft 50 mg daily. Prior to moving in August 2017 her prior provider told her to increase to 75 mg daily. She states that she did not like the apathetic side effects she experienced at 75 mg dose, so she decreased back  down to the 50 mg dose daily. She reports being seen by counselor many times in the past. She has seek the help of a Marketing executive in [place] Leanor Rub[place]). She has yet to establish with her.   Depression screen [facility] 2/9 07/24/2020 06/12/2020 01/04/2020 05/24/2019 01/20/2019  Decreased Interest 1 3 0 0 0  Down, Depressed, Hopeless 1 3 0 0 0  PHQ - 2 Score 2 6 0 0 0  Altered sleeping 1 1 0 - -  Tired, decreased energy 1 2 0 - -  Change in appetite 1 3 0 - -  Feeling bad or failure about yourself  0 2 0 - -  Trouble concentrating 0 1 1 - -  Moving slowly or fidgety/restless 0 2 0 - -  Suicidal thoughts 0 0 0 - -  PHQ-9 Score 5 17 1  - -  Difficult doing work/chores - - - - -  Some recent data might be hidden   GAD 7 : Generalized Anxiety Score 07/24/2020 06/12/2020 01/04/2020 05/24/2019  Nervous, Anxious, on Edge 1 3 0 0  Control/stop worrying 1 3 0 0  Worry too much - different things 1 3 0 0  Trouble relaxing 1 3 0 0  Restless 0 3 0 0  Easily annoyed or irritable 0 1 0 0  Afraid - awful might happen 0 1 0 0  Total GAD 7 Score 4 17 0 0  Anxiety  Difficulty - - - Not difficult at all    Immunization History  Administered Date(s) Administered  . PFIZER(Purple Top)SARS-COV-2 Vaccination 06/09/2019, 07/16/2019  . Tdap 10/26/2012  . Zoster Recombinat (Shingrix) 09/16/2017, 11/26/2017     Past Medical History:  Diagnosis Date  . Allergy   . Anemia   . Anxiety   . Depression   . Fatigue   . Frequent UTI   . Hyperlipidemia   . Migraine   . Reactive airway disease    patient unaware RAD  . Renal stones   . Rosacea   . Vertigo    was on antivert   Allergies  Allergen Reactions  . Codeine    Past Surgical History:  Procedure Laterality Date  . BREAST BIOPSY  1990   benign  . BREAST EXCISIONAL BIOPSY Right 30+ yrs ago   benign  . LASIK  2001  . TONSILLECTOMY AND ADENOIDECTOMY  1985  . WISDOM TOOTH EXTRACTION     Family History  Problem Relation Age of Onset  .  Diabetes Mother   . Mental illness Mother        Hoarder  . Dementia Mother   . Asthma Mother   . Depression Mother   . Migraines Mother   . Atrial fibrillation Father   . Hypertension Father   . Diabetes Maternal Aunt   . Hearing loss Maternal Aunt   . Stroke Maternal Grandmother   . Arthritis Maternal Grandfather   . Heart disease Maternal Grandfather   . Colon polyps Maternal Grandfather   . Colon polyps Paternal Grandfather   . Colon cancer Neg Hx   . Esophageal cancer Neg Hx   . Rectal cancer Neg Hx   . Stomach cancer Neg Hx    Social History   Social History Narrative   Married to [place]. Two adult children Carla Beltran and Carla Beltran (in college).    Moved from [place] [facility]  area) 09/2015.    SAHM. College educated.    Drinks one cup of coffee a day.   Takes a daily vitamin.   Wears seatbelts, bicycle helmet and smoke detector in the home.   Tries to exercise routinely.   Feels safe in her relationships.    Allergies as of 07/24/2020      Reactions   Codeine       Medication List       Accurate as of July 24, 2020  9:41 AM. If you have any questions, ask your nurse or doctor.        STOP taking these medications   PARoxetine 10 MG tablet Commonly known as: PAXIL Stopped by: Howard Pouch, DO     TAKE these medications   b complex vitamins capsule Take 1 capsule by mouth daily.   escitalopram 10 MG tablet Commonly known as: Lexapro Take 1 tablet (10 mg total) by mouth daily.   Fish Oil 1000 MG Caps Take 2 capsules by mouth daily. 4300mg  daily   MAGNESIUM PO Take 1,077 mg by mouth.   OVER THE COUNTER MEDICATION Herbal supplements for brain and adrenal support   OVER THE COUNTER MEDICATION Stress care vitamin   OVER THE COUNTER MEDICATION Lean care   OVER THE COUNTER MEDICATION Lauricidin   PROBIOTIC ACIDOPHILUS PO Take 1 capsule by mouth daily.   VITAMIN D PO Take 10,000 Units by mouth daily.   VITAMIN K PO Take by mouth.       All  past medical history, surgical history, allergies, family history, immunizations  andmedications were updated in the EMR today and reviewed under the history and medication portions of their EMR.     ROS: 14 pt review of systems performed and negative (unless mentioned in an HPI)  Objective: BP 100/63   Pulse 92   Temp 98.3 F (36.8 C) (Oral)   Ht 5\' 5"  (1.651 m)   Wt 142 lb (64.4 kg)   LMP 12/31/2014   SpO2 98%   BMI 23.63 kg/m  Gen: Afebrile. No acute distress.  HENT: AT. [place].  Eyes:Pupils Equal Round Reactive to light, Extraocular movements intact,  Conjunctiva without redness, discharge or icterus. Neuro: Normal gait. PERLA. EOMi. Alert. Oriented x3 Psych: mildly anxious today. Normal affect, dress and demeanor. Normal speech. Normal thought content and judgment..    No exam data present  Assessment/plan: WILLIS KUIPERS is a 55 y.o. female present for [facility] Generalized anxiety disorder/panic attacks/depression - lexapro working better for her than paxil. More coverage needed for gad . - increase lexapro to 20 mg qd.  - continue Marketing executive. - F/U 5 to 6 months  No orders of the defined types were placed in this encounter.  Referral Orders  No referral(s) requested today    This visit occurred during the SARS-CoV-2 public health emergency.  Safety protocols were in place, including screening questions prior to the visit, additional usage of staff PPE, and extensive cleaning of exam room while observing appropriate contact time as indicated for disinfecting solutions.    Electronically signed by: Howard Pouch, DO [place]

## 2020-07-24 NOTE — Patient Instructions (Signed)
Increase lexapro to 20 mg daily.  Glad you are starting to feel better.   Follow up in 5.5 months.   Try to increase your exercise regimen as well. Cardio exercise can help with anxiety- recommendations are 150 minutes a week.      http://NIMH.NIH.Gov">  Generalized Anxiety Disorder, Adult Generalized anxiety disorder (GAD) is a mental health condition. Unlike normal worries, anxiety related to GAD is not triggered by a specific event. These worries do not fade or get better with time. GAD interferes with relationships, work, and school. GAD symptoms can vary from mild to severe. People with severe GAD can have intense waves of anxiety with physical symptoms that are similar to panic attacks. What are the causes? The exact cause of GAD is not known, but the following are believed to have an impact:  Differences in natural brain chemicals.  Genes passed down from parents to children.  Differences in the way threats are perceived.  Development during childhood.  Personality. What increases the risk? The following factors may make you more likely to develop this condition:  Being female.  Having a family history of anxiety disorders.  Being very shy.  Experiencing very stressful life events, such as the death of a loved one.  Having a very stressful family environment. What are the signs or symptoms? People with GAD often worry excessively about many things in their lives, such as their health and family. Symptoms may also include:  Mental and emotional symptoms: ? Worrying excessively about natural disasters. ? Fear of being late. ? Difficulty concentrating. ? Fears that others are judging your performance.  Physical symptoms: ? Fatigue. ? Headaches, muscle tension, muscle twitches, trembling, or feeling shaky. ? Feeling like your heart is pounding or beating very fast. ? Feeling out of breath or like you cannot take a deep breath. ? Having trouble falling asleep or  staying asleep, or experiencing restlessness. ? Sweating. ? Nausea, diarrhea, or irritable bowel syndrome (IBS).  Behavioral symptoms: ? Experiencing erratic moods or irritability. ? Avoidance of new situations. ? Avoidance of people. ? Extreme difficulty making decisions. How is this diagnosed? This condition is diagnosed based on your symptoms and medical history. You will also have a physical exam. Your health care provider may perform tests to rule out other possible causes of your symptoms. To be diagnosed with GAD, a person must have anxiety that:  Is out of his or her control.  Affects several different aspects of his or her life, such as work and relationships.  Causes distress that makes him or her unable to take part in normal activities.  Includes at least three symptoms of GAD, such as restlessness, fatigue, trouble concentrating, irritability, muscle tension, or sleep problems. Before your health care provider can confirm a diagnosis of GAD, these symptoms must be present more days than they are not, and they must last for 6 months or longer. How is this treated? This condition may be treated with:  Medicine. Antidepressant medicine is usually prescribed for long-term daily control. Anti-anxiety medicines may be added in severe cases, especially when panic attacks occur.  Talk therapy (psychotherapy). Certain types of talk therapy can be helpful in treating GAD by providing support, education, and guidance. Options include: ? Cognitive behavioral therapy (CBT). People learn coping skills and self-calming techniques to ease their physical symptoms. They learn to identify unrealistic thoughts and behaviors and to replace them with more appropriate thoughts and behaviors. ? Acceptance and commitment therapy (ACT). This treatment teaches  people how to be mindful as a way to cope with unwanted thoughts and feelings. ? Biofeedback. This process trains you to manage your body's  response (physiological response) through breathing techniques and relaxation methods. You will work with a therapist while machines are used to monitor your physical symptoms.  Stress management techniques. These include yoga, meditation, and exercise. A mental health specialist can help determine which treatment is best for you. Some people see improvement with one type of therapy. However, other people require a combination of therapies.   Follow these instructions at home: Lifestyle  Maintain a consistent routine and schedule.  Anticipate stressful situations. Create a plan, and allow extra time to work with your plan.  Practice stress management or self-calming techniques that you have learned from your therapist or your health care provider. General instructions  Take over-the-counter and prescription medicines only as told by your health care provider.  Understand that you are likely to have setbacks. Accept this and be kind to yourself as you persist to take better care of yourself.  Recognize and accept your accomplishments, even if you judge them as small.  Keep all follow-up visits as told by your health care provider. This is important. Contact a health care provider if:  Your symptoms do not get better.  Your symptoms get worse.  You have signs of depression, such as: ? A persistently sad or irritable mood. ? Loss of enjoyment in activities that used to bring you joy. ? Change in weight or eating. ? Changes in sleeping habits. ? Avoiding friends or family members. ? Loss of energy for normal tasks. ? Feelings of guilt or worthlessness. Get help right away if:  You have serious thoughts about hurting yourself or others. If you ever feel like you may hurt yourself or others, or have thoughts about taking your own life, get help right away. Go to your nearest emergency department or:  Call your local emergency services (911 in the [place]  Call a suicide crisis  helpline, such as the [facility] at 913 424 3745. This is open 24 hours a day in the [place]  Text the Crisis Text Line at 747 016 0361 (in the [place].). Summary  Generalized anxiety disorder (GAD) is a mental health condition that involves worry that is not triggered by a specific event.  People with GAD often worry excessively about many things in their lives, such as their health and family.  GAD may cause symptoms such as restlessness, trouble concentrating, sleep problems, frequent sweating, nausea, diarrhea, headaches, and trembling or muscle twitching.  A mental health specialist can help determine which treatment is best for you. Some people see improvement with one type of therapy. However, other people require a combination of therapies. This information is not intended to replace advice given to you by your health care provider. Make sure you discuss any questions you have with your health care provider. Document Revised: 11/24/2018 Document Reviewed: 11/24/2018 Elsevier Patient Education  [place].

## 2020-09-10 ENCOUNTER — Encounter: Payer: Self-pay | Admitting: Gastroenterology

## 2020-12-16 ENCOUNTER — Other Ambulatory Visit: Payer: Self-pay | Admitting: Family Medicine

## 2021-01-01 ENCOUNTER — Encounter: Payer: Self-pay | Admitting: Family Medicine

## 2021-01-01 ENCOUNTER — Ambulatory Visit (INDEPENDENT_AMBULATORY_CARE_PROVIDER_SITE_OTHER): Payer: BC Managed Care – PPO | Admitting: Family Medicine

## 2021-01-01 ENCOUNTER — Other Ambulatory Visit: Payer: Self-pay

## 2021-01-01 VITALS — BP 122/67 | HR 78 | Temp 98.4°F | Ht 65.0 in | Wt 154.0 lb

## 2021-01-01 DIAGNOSIS — F411 Generalized anxiety disorder: Secondary | ICD-10-CM | POA: Diagnosis not present

## 2021-01-01 MED ORDER — ESCITALOPRAM OXALATE 20 MG PO TABS
20.0000 mg | ORAL_TABLET | Freq: Every day | ORAL | 1 refills | Status: DC
Start: 2021-01-01 — End: 2021-06-11

## 2021-01-01 NOTE — Patient Instructions (Signed)
   Next appt 5.5 months.

## 2021-01-01 NOTE — Progress Notes (Signed)
Patient ID: Carla Beltran, female  DOB: 10/29/1965, 55 y.o.   MRN: [id number] Patient Care Team    Relationship Specialty Notifications Start End  [name], DO PCP - General Family Medicine  05/28/16   Doran Stabler, MD Consulting Physician Gastroenterology  03/13/17     Chief Complaint  Patient presents with   Anxiety    CMC; pt is not fasting    Subjective: Carla Beltran is a 55 y.o.  Female  present for [facility] Anxiety/phobia:  Pt reports she is feeling well on lexapro.  She is continuing to see her Marketing executive. She is dealing with relocating her father to ALF 2/2 to falls.  Original note on establishment: Patient presents for establishment today and admits to increased anxiety and panic attacks. She has fears that she has trouble controlling. She worries about everything. She has a strong fear of driving on the [place], especially in areas she is unfamiliar with. She endorses panic attacks that occur during driving and at other times. She states she experiences a increased heart rate and then feeling like she is "out of her body ". She reports increased anxiety in her life especially around times when she is moving, such as currently. She states she's always had anxiety. She reports she did not have a great childhood. Her mother now has dementia and is in [place] in a nursing facility, this also caused her increased anxiety. Her mother with hoarder. She has 2 daughters, which also are affected by anxiety and are on Celexa. She states her daughters are not affected by his much panic she is. She reports being on Zoloft since 2006. She was initially on 25 mg of Zoloft daily, and eventually increased up to Zoloft 50 mg daily. Prior to moving in August 2017 her prior provider told her to increase to 75 mg daily. She states that she did not like the apathetic side effects she experienced at 75 mg dose, so she decreased back down to the 50 mg dose daily. She reports  being seen by counselor many times in the past. She has seek the help of a Marketing executive in [place] Leanor Rub[place]). She has yet to establish with her.    Depression screen [facility] 2/9 07/24/2020 06/12/2020 01/04/2020 05/24/2019 01/20/2019  Decreased Interest 1 3 0 0 0  Down, Depressed, Hopeless 1 3 0 0 0  PHQ - 2 Score 2 6 0 0 0  Altered sleeping 1 1 0 - -  Tired, decreased energy 1 2 0 - -  Change in appetite 1 3 0 - -  Feeling bad or failure about yourself  0 2 0 - -  Trouble concentrating 0 1 1 - -  Moving slowly or fidgety/restless 0 2 0 - -  Suicidal thoughts 0 0 0 - -  PHQ-9 Score 5 17 1  - -  Difficult doing work/chores - - - - -  Some recent data might be hidden   GAD 7 : Generalized Anxiety Score 07/24/2020 06/12/2020 01/04/2020 05/24/2019  Nervous, Anxious, on Edge 1 3 0 0  Control/stop worrying 1 3 0 0  Worry too much - different things 1 3 0 0  Trouble relaxing 1 3 0 0  Restless 0 3 0 0  Easily annoyed or irritable 0 1 0 0  Afraid - awful might happen 0 1 0 0  Total GAD 7 Score 4 17 0 0  Anxiety Difficulty - - - Not  difficult at all    Immunization History  Administered Date(s) Administered   PFIZER(Purple Top)SARS-COV-2 Vaccination 06/09/2019, 07/16/2019   Tdap 10/26/2012   Zoster Recombinat (Shingrix) 09/16/2017, 11/26/2017     Past Medical History:  Diagnosis Date   Allergy    Anemia    Anxiety    Depression    Fatigue    Frequent UTI    Hyperlipidemia    Migraine    Reactive airway disease    patient unaware RAD   Renal stones    Rosacea    Vertigo    was on antivert   Allergies  Allergen Reactions   Codeine    Past Surgical History:  Procedure Laterality Date   BREAST BIOPSY  1990   benign   BREAST EXCISIONAL BIOPSY Right 30+ yrs ago   benign   LASIK  2001   TONSILLECTOMY AND ADENOIDECTOMY  1985   WISDOM TOOTH EXTRACTION     Family History  Problem Relation Age of Onset   Diabetes Mother    Mental illness Mother        52    Dementia Mother    Asthma Mother    Depression Mother    Migraines Mother    Atrial fibrillation Father    Hypertension Father    Diabetes Maternal Aunt    Hearing loss Maternal Aunt    Stroke Maternal Grandmother    Arthritis Maternal Grandfather    Heart disease Maternal Grandfather    Colon polyps Maternal Grandfather    Colon polyps Paternal Grandfather    Colon cancer Neg Hx    Esophageal cancer Neg Hx    Rectal cancer Neg Hx    Stomach cancer Neg Hx    Social History   Social History Narrative   Married to [place]. Two adult children Carla Beltran and Carla Beltran (in college).    Moved from [place] [facility]  area) 09/2015.    SAHM. College educated.    Drinks one cup of coffee a day.   Takes a daily vitamin.   Wears seatbelts, bicycle helmet and smoke detector in the home.   Tries to exercise routinely.   Feels safe in her relationships.    Allergies as of 01/01/2021       Reactions   Codeine         Medication List        Accurate as of January 01, 2021  9:46 AM. If you have any questions, ask your nurse or doctor.          STOP taking these medications    b complex vitamins capsule Stopped by: Howard Pouch, DO   MAGNESIUM PO Stopped by: Howard Pouch, DO   OVER THE COUNTER MEDICATION Stopped by: Howard Pouch, DO   OVER THE COUNTER MEDICATION Stopped by: Howard Pouch, DO   OVER THE COUNTER MEDICATION Stopped by: Howard Pouch, DO       TAKE these medications    escitalopram 20 MG tablet Commonly known as: Lexapro Take 1 tablet (20 mg total) by mouth daily.   Fish Oil 1000 MG Caps Take 2 capsules by mouth daily. 4300mg  daily   OVER THE COUNTER MEDICATION Herbal supplements for brain and adrenal support   PROBIOTIC ACIDOPHILUS PO Take 1 capsule by mouth daily.   VITAMIN D PO Take 10,000 Units by mouth daily.   VITAMIN K PO Take by mouth.        All past medical history, surgical history, allergies, family history, immunizations  andmedications  were updated in the EMR today and reviewed under the history and medication portions of their EMR.     ROS: 14 pt review of systems performed and negative (unless mentioned in an HPI)  Objective: BP 122/67   Pulse 78   Temp 98.4 F (36.9 C) (Oral)   Ht 5\' 5"  (1.651 m)   Wt 154 lb (69.9 kg)   LMP 12/31/2014   SpO2 99%   BMI 25.63 kg/m  Gen: Afebrile. No acute distress.  HENT: AT. [place].  Eyes:Pupils Equal Round Reactive to light, Extraocular movements intact,  Conjunctiva without redness, discharge or icterus. CV: RRR  Chest: CTAB, no wheeze or crackles Skin: no rashes, purpura or petechiae.  Psych: Normal affect, dress and demeanor. Normal speech. Normal thought content and judgment..    No results found.  Assessment/plan: Carla Beltran is a 55 y.o. female present for [facility] Generalized anxiety disorder/panic attacks/depression - stable.  - continue lexapro to 20 mg qd.  - continue Marketing executive. - F/U 5 to 6 months  Meds ordered this encounter  Medications   escitalopram (LEXAPRO) 20 MG tablet    Sig: Take 1 tablet (20 mg total) by mouth daily.    Dispense:  90 tablet    Refill:  1    Referral Orders  No referral(s) requested today    This visit occurred during the SARS-CoV-2 public health emergency.  Safety protocols were in place, including screening questions prior to the visit, additional usage of staff PPE, and extensive cleaning of exam room while observing appropriate contact time as indicated for disinfecting solutions.    Electronically signed by: Howard Pouch, DO [place]

## 2021-05-22 ENCOUNTER — Other Ambulatory Visit: Payer: Self-pay | Admitting: Family Medicine

## 2021-06-12 ENCOUNTER — Encounter: Payer: Self-pay | Admitting: Family Medicine

## 2021-06-12 ENCOUNTER — Ambulatory Visit (INDEPENDENT_AMBULATORY_CARE_PROVIDER_SITE_OTHER): Payer: BC Managed Care – PPO | Admitting: Family Medicine

## 2021-06-12 VITALS — BP 117/69 | HR 82 | Temp 98.2°F | Ht 65.0 in | Wt 162.0 lb

## 2021-06-12 DIAGNOSIS — F411 Generalized anxiety disorder: Secondary | ICD-10-CM

## 2021-06-12 DIAGNOSIS — F410 Panic disorder [episodic paroxysmal anxiety] without agoraphobia: Secondary | ICD-10-CM

## 2021-06-12 DIAGNOSIS — N390 Urinary tract infection, site not specified: Secondary | ICD-10-CM

## 2021-06-12 DIAGNOSIS — F330 Major depressive disorder, recurrent, mild: Secondary | ICD-10-CM | POA: Diagnosis not present

## 2021-06-12 MED ORDER — NITROFURANTOIN MONOHYD MACRO 100 MG PO CAPS: 100.0000 mg | ORAL_CAPSULE | ORAL | 1 refills | Status: DC | PRN

## 2021-06-12 MED ORDER — ESCITALOPRAM OXALATE 20 MG PO TABS: 20.0000 mg | ORAL_TABLET | Freq: Every day | ORAL | 1 refills | Status: DC

## 2021-06-12 NOTE — Patient Instructions (Signed)
?  Great to see you today.  ?I have refilled the medication(s) we provide.  ? ?If labs were collected, we will inform you of lab results once received either by echart message or telephone call.  ? - echart message- for normal results that have been seen by the patient already.  ? - telephone call: abnormal results or if patient has not viewed results in their echart. ? ?I would recommend you schedule physicals yearly to cover preventive health needs, which includes screening labs, images and referrals need for such.  ? ?Return in about 24 weeks (around 11/27/2021) for Routine chronic condition follow-up. ? ?

## 2021-06-12 NOTE — Progress Notes (Signed)
? ? ? ?Patient ID: Carla Beltran, female  DOB: [birth year], 56 y.o.   MRN: [id number] ?Patient Care Team  ?  Relationship Specialty Notifications Start End  ?[name], DO PCP - General Family Medicine  05/28/16   ?Doran Stabler, MD Consulting Physician Gastroenterology  03/13/17   ? ? ?Chief Complaint  ?Patient presents with  ? Anxiety  ?  Pt is not fasting; cmc  ? ? ?Subjective: ?Carla Beltran is a 56 y.o.  Female  present for [facility] ?Anxiety/phobia:  ?Pt reports she is feeling well on lexapro.  She is continuing to see her Marketing executive. Both her daughters are expecting mid-end of October. She is excited. She reports life is really good right now and she is happy.  ?Original note on establishment: ?Patient presents for establishment today and admits to increased anxiety and panic attacks. She has fears that she has trouble controlling. She worries about everything. She has a strong fear of driving on the [place], especially in areas she is unfamiliar with. She endorses panic attacks that occur during driving and at other times. She states she experiences a increased heart rate and then feeling like she is "out of her body ". She reports increased anxiety in her life especially around times when she is moving, such as currently. She states she's always had anxiety. She reports she did not have a great childhood. Her mother now has dementia and is in [place] in a nursing facility, this also caused her increased anxiety. Her mother with hoarder. She has 2 daughters, which also are affected by anxiety and are on Celexa. She states her daughters are not affected by his much panic she is. She reports being on Zoloft since 2006. She was initially on 25 mg of Zoloft daily, and eventually increased up to Zoloft 50 mg daily. Prior to moving in August 2017 her prior provider told her to increase to 75 mg daily. She states that she did not like the apathetic side effects she experienced at 75 mg dose,  so she decreased back down to the 50 mg dose daily. She reports being seen by counselor many times in the past. She has seek the help of a Marketing executive in [place] Leanor Rubenstein). She has yet to establish with her. ? ?Recurrent UTI; ?Reports she is still taking macrobid post coital and it has been effective. She reports her prescription is expired now.  ?  ? ? ?  06/12/2021  ?  8:56 AM 07/24/2020  ?  9:28 AM 06/12/2020  ?  9:46 AM 01/04/2020  ?  9:45 AM 05/24/2019  ? 10:06 AM  ?Depression screen PHQ 2/9  ?Decreased Interest 0 1 3 0 0  ?Down, Depressed, Hopeless 0 1 3 0 0  ?PHQ - 2 Score 0 2 6 0 0  ?Altered sleeping 0 1 1 0   ?Tired, decreased energy 0 1 2 0   ?Change in appetite 0 1 3 0   ?Feeling bad or failure about yourself  0 0 2 0   ?Trouble concentrating 0 0 1 1   ?Moving slowly or fidgety/restless 0 0 2 0   ?Suicidal thoughts 0 0 0 0   ?PHQ-9 Score 0 '5 17 1   '$ ? ? ?  06/12/2021  ?  8:56 AM 07/24/2020  ?  9:28 AM 06/12/2020  ?  9:47 AM 01/04/2020  ?  9:46 AM  ?GAD 7 : Generalized Anxiety Score  ?Nervous, Anxious, on  Edge 0 1 3 0  ?Control/stop worrying 0 1 3 0  ?Worry too much - different things 0 1 3 0  ?Trouble relaxing 0 1 3 0  ?Restless 0 0 3 0  ?Easily annoyed or irritable 0 0 1 0  ?Afraid - awful might happen 0 0 1 0  ?Total GAD 7 Score 0 4 17 0  ? ? ?Immunization History  ?Administered Date(s) Administered  ? PFIZER(Purple Top)SARS-COV-2 Vaccination 06/09/2019, 07/16/2019  ? Tdap 10/26/2012  ? Zoster Recombinat (Shingrix) 09/16/2017, 11/26/2017  ? ? ? ?Past Medical History:  ?Diagnosis Date  ? Allergy   ? Anemia   ? Anxiety   ? Depression   ? Fatigue   ? Frequent UTI   ? Hyperlipidemia   ? Migraine   ? Reactive airway disease   ? patient unaware RAD  ? Renal stones   ? Rosacea   ? Vertigo   ? was on antivert  ? ?Allergies  ?Allergen Reactions  ? Codeine   ? ?Past Surgical History:  ?Procedure Laterality Date  ? BREAST BIOPSY  1990  ? benign  ? BREAST EXCISIONAL BIOPSY Right 30+ yrs ago  ? benign  ?  LASIK  2001  ? TONSILLECTOMY AND ADENOIDECTOMY  1985  ? WISDOM TOOTH EXTRACTION    ? ?Family History  ?Problem Relation Age of Onset  ? Diabetes Mother   ? Mental illness Mother   ?     Hoarder  ? Dementia Mother   ? Asthma Mother   ? Depression Mother   ? Migraines Mother   ? Atrial fibrillation Father   ? Hypertension Father   ? Diabetes Maternal Aunt   ? Hearing loss Maternal Aunt   ? Stroke Maternal Grandmother   ? Arthritis Maternal Grandfather   ? Heart disease Maternal Grandfather   ? Colon polyps Maternal Grandfather   ? Colon polyps Paternal Grandfather   ? Colon cancer Neg Hx   ? Esophageal cancer Neg Hx   ? Rectal cancer Neg Hx   ? Stomach cancer Neg Hx   ? ?Social History  ? ?Social History Narrative  ? Married [place]. Two adult children [name] and [name] (in college).   ? Moved from [place] [facility]  area) 09/2015.   ? SAHM. College educated.   ? Drinks one cup of coffee a day.  ? Takes a daily vitamin.  ? Wears seatbelts, bicycle helmet and smoke detector in the home.  ? Tries to exercise routinely.  ? Feels safe in her relationships.  ? ? ?Allergies as of 06/12/2021   ? ?   Reactions  ? Codeine   ? ?  ? ?  ?Medication List  ?  ? ?  ? Accurate as of June 12, 2021  9:08 AM. If you have any questions, ask your nurse or doctor.  ?  ?  ? ?  ? ?escitalopram 20 MG tablet ?Commonly known as: Lexapro ?Take 1 tablet (20 mg total) by mouth daily. ?  ?Fish Oil 1000 MG Caps ?Take 2 capsules by mouth daily. '4300mg'$  daily ?  ?nitrofurantoin (macrocrystal-monohydrate) 100 MG capsule ?Commonly known as: MACROBID ?Take 1 capsule (100 mg total) by mouth as needed. 1 capsule after intercourse to prevent UTI ?Started by: Howard Pouch, DO ?  ?[place] ?Herbal supplements for brain and adrenal support ?  ?PROBIOTIC ACIDOPHILUS PO ?Take 1 capsule by mouth daily. ?  ?VITAMIN D PO ?Take 10,000 Units by mouth daily. ?  ?VITAMIN  K PO ?Take by mouth. ?  ? ?  ? ? ?All past medical history, surgical history,  allergies, family history, immunizations andmedications were updated in the EMR today and reviewed under the history and medication portions of their EMR.    ? ?ROS: 14 pt review of systems performed and negative (unless mentioned in an HPI) ? ?Objective: ?BP 117/69   Pulse 82   Temp 98.2 ?F (36.8 ?C) (Oral)   Ht '5\' 5"'$  (1.651 m)   Wt 162 lb (73.5 kg)   LMP 12/31/2014   SpO2 100%   BMI 26.96 kg/m?  ?Physical Exam ?Vitals and nursing note reviewed.  ?Constitutional:   ?   General: She is not in acute distress. ?   Appearance: Normal appearance. She is normal weight. She is not ill-appearing or toxic-appearing.  ?HENT:  ?   Head: Normocephalic and atraumatic.  ?Eyes:  ?   Extraocular Movements: Extraocular movements intact.  ?   Conjunctiva/sclera: Conjunctivae normal.  ?   Pupils: Pupils are equal, round, and reactive to light.  ?Cardiovascular:  ?   Rate and Rhythm: Normal rate and regular rhythm.  ?Skin: ?   Findings: No rash.  ?Neurological:  ?   Mental Status: She is alert and oriented to person, place, and time. Mental status is at baseline.  ?Psychiatric:     ?   Mood and Affect: Mood normal.     ?   Behavior: Behavior normal.     ?   Thought Content: Thought content normal.     ?   Judgment: Judgment normal.  ? ? ?No results found. ? ?Assessment/plan: ?Carla Beltran is a 56 y.o. female present for [place] ?Generalized anxiety disorder/panic attacks/depression ?Stable.  ?Continue  lexapro to 20 mg qd.  ?- continue  Marketing executive. ?- F/U 5.5 mos.  ? ?Recurrent UTI: ?Stable.  ?Refilled macrobid 100 mg PRN post coital.  ? ?Meds ordered this encounter  ?Medications  ? escitalopram (LEXAPRO) 20 MG tablet  ?  Sig: Take 1 tablet (20 mg total) by mouth daily.  ?  Dispense:  90 tablet  ?  Refill:  1  ? nitrofurantoin, macrocrystal-monohydrate, (MACROBID) 100 MG capsule  ?  Sig: Take 1 capsule (100 mg total) by mouth as needed. 1 capsule after intercourse to prevent UTI  ?  Dispense:  90 capsule  ?  Refill:  1   ? ? ?Referral Orders  ?No referral(s) requested today  ? ? ?This visit occurred during the SARS-CoV-2 public health emergency.  Safety protocols were in place, including screening questions prior to the visit, addit

## 2021-09-11 IMAGING — MG DIGITAL SCREENING BILAT W/ TOMO W/ CAD
8 series · 9 of 24 positions shown · non-contrast
Comparison: Previous exam(s).

CLINICAL DATA: Screening.

EXAM:
DIGITAL SCREENING BILATERAL MAMMOGRAM WITH TOMO AND CAD

[L CC synth-2D]
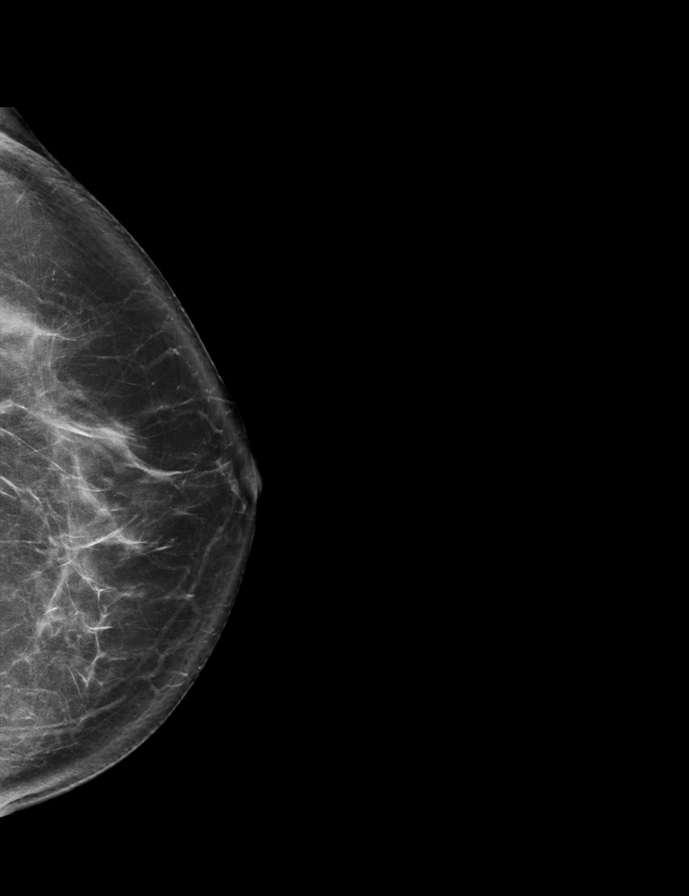

[L MLO synth-2D]
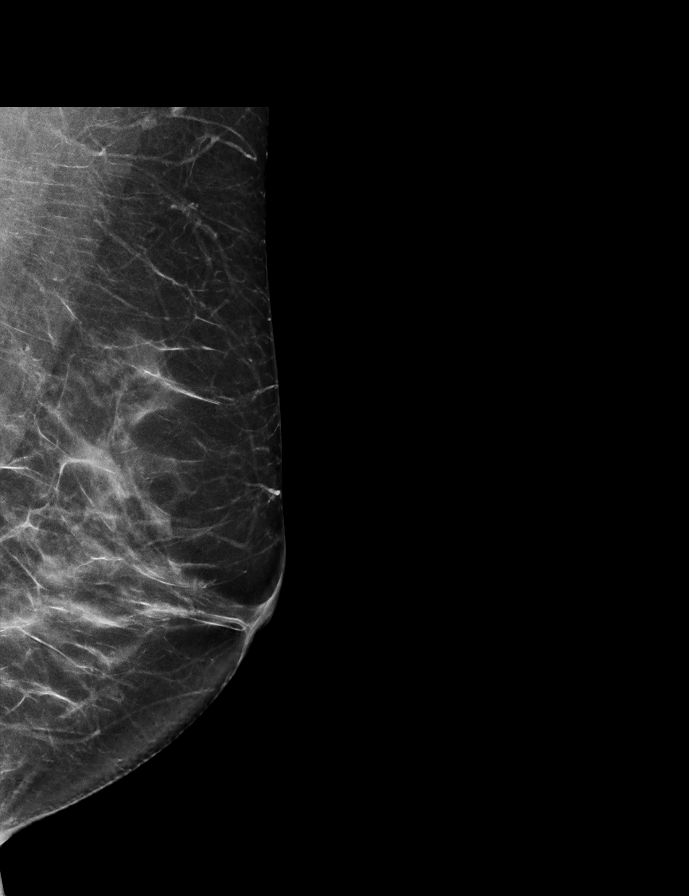

[R MLO synth-2D]
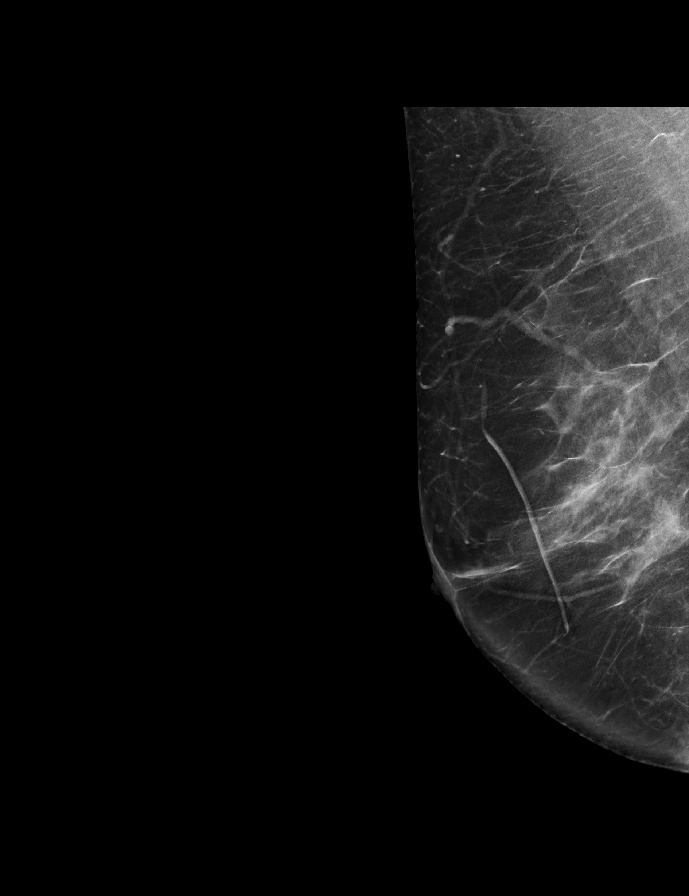

[R CC synth-2D]
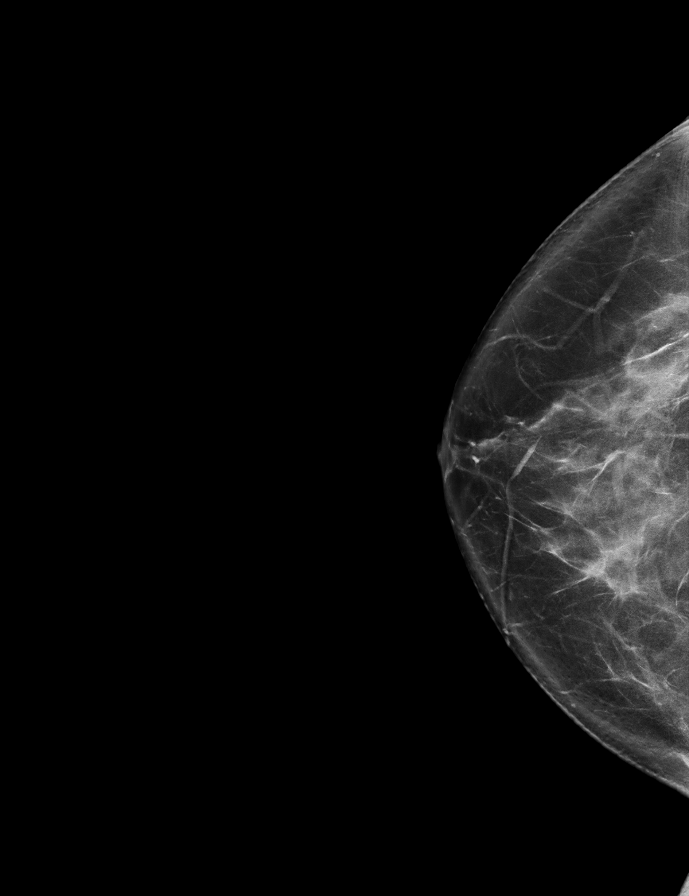

[R CC tomo · 2 of 76 frames shown]
[frame 25/76]
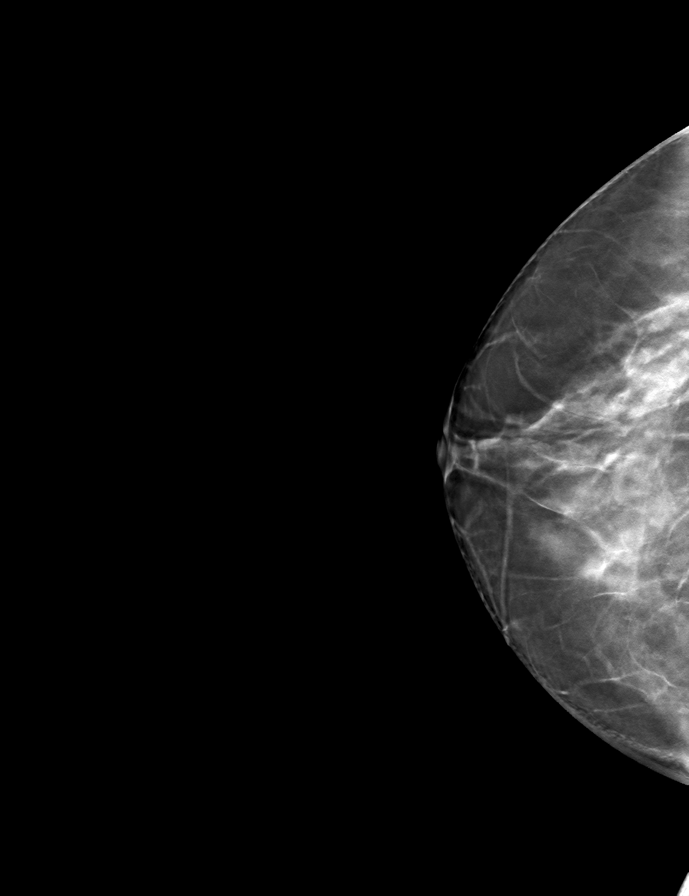
[frame 39/76]
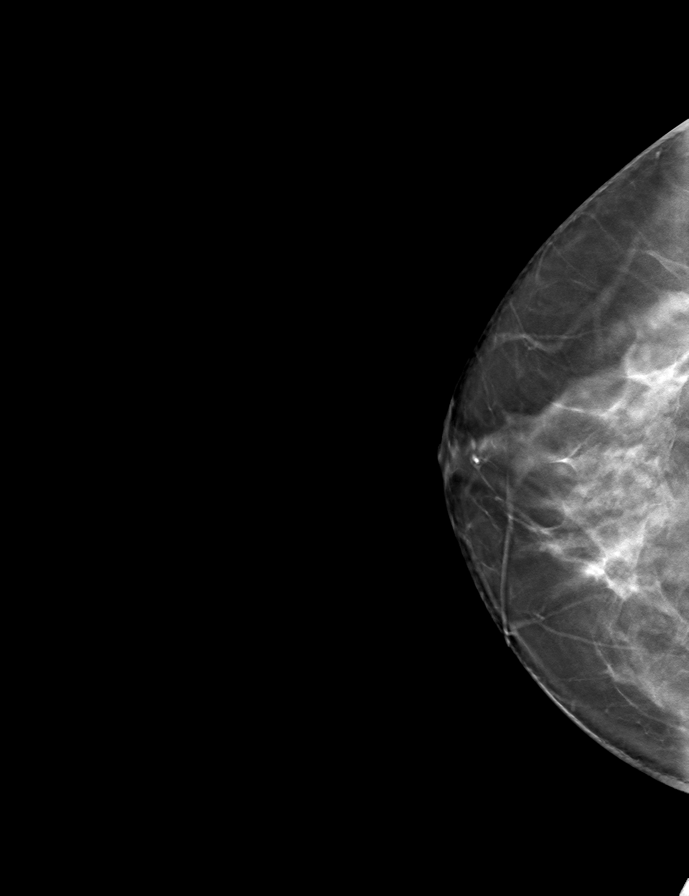

[L MLO tomo · tomo slice 35/68.0]
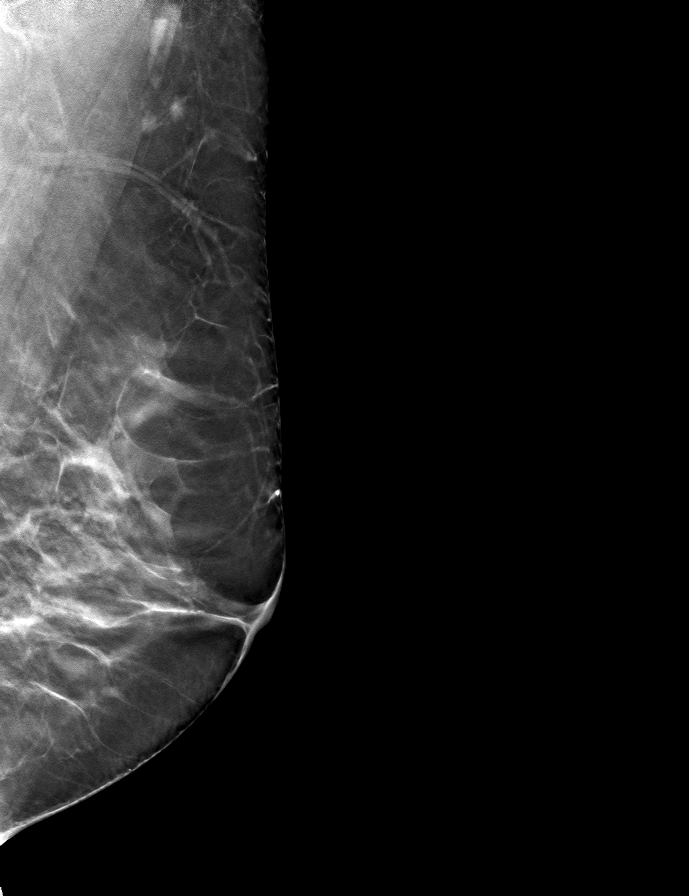

[R MLO tomo · tomo slice 37/72.0]
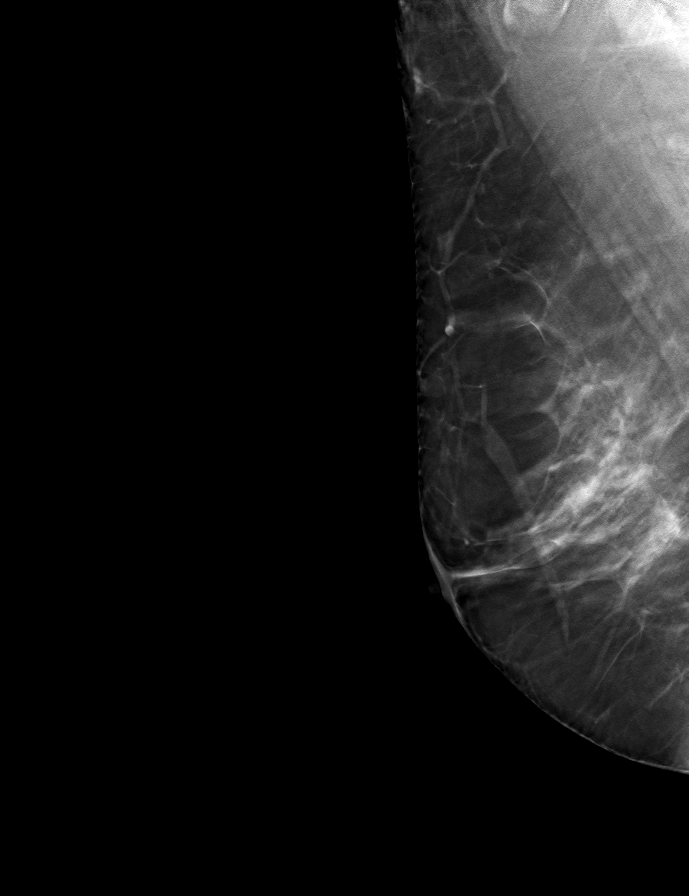

[L CC tomo · tomo slice 39/78.0]
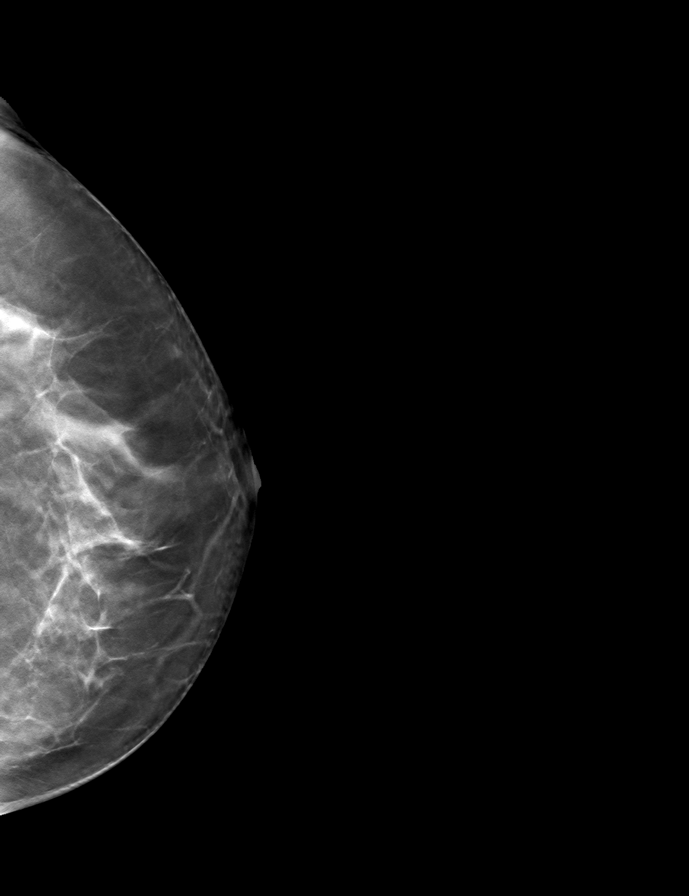

[9 of 24 positions shown; findings below may reference images not displayed]

ACR Breast Density Category c: The breast tissue is heterogeneously
dense, which may obscure small masses.
FINDINGS: There are no findings suspicious for malignancy. Images were
processed with CAD.
IMPRESSION: No mammographic evidence of malignancy. A result letter of this
screening mammogram will be mailed directly to the patient.

RECOMMENDATION:
Screening mammogram in one year. (Code:FT-U-LHB)

BI-RADS CATEGORY  1: Negative.

## 2021-09-24 ENCOUNTER — Other Ambulatory Visit: Payer: Self-pay | Admitting: Family Medicine

## 2021-09-24 DIAGNOSIS — Z1231 Encounter for screening mammogram for malignant neoplasm of breast: Secondary | ICD-10-CM

## 2021-10-02 ENCOUNTER — Ambulatory Visit
Admission: RE | Admit: 2021-10-02 | Discharge: 2021-10-02 | Disposition: A | Discharge status: Home / Self Care | Payer: BC Managed Care – PPO | Source: Ambulatory Visit | Attending: Family Medicine | Admitting: Family Medicine

## 2021-10-02 DIAGNOSIS — Z1231 Encounter for screening mammogram for malignant neoplasm of breast: Secondary | ICD-10-CM

## 2021-10-03 ENCOUNTER — Encounter: Payer: Self-pay | Admitting: Gastroenterology

## 2021-10-08 ENCOUNTER — Ambulatory Visit (AMBULATORY_SURGERY_CENTER): Payer: Self-pay

## 2021-10-08 VITALS — Ht 65.0 in | Wt 164.0 lb

## 2021-10-08 DIAGNOSIS — Z86010 Personal history of colonic polyps: Secondary | ICD-10-CM

## 2021-10-08 MED ORDER — NA SULFATE-K SULFATE-MG SULF 17.5-3.13-1.6 GM/177ML PO SOLN: 1.0000 | ORAL | 0 refills | Status: DC

## 2021-10-08 NOTE — Progress Notes (Signed)
No egg or soy allergy known to patient  No issues known to pt with past sedation with any surgeries or procedures Patient denies ever being told they had issues or difficulty with intubation  No FH of Malignant Hyperthermia Pt is not on diet pills Pt is not on  home 02  Pt is not on blood thinners  Pt denies issues with constipation  No A fib or A flutter Have any cardiac testing pending--denied Pt instructed to use Singlecare.com or GoodRx for a price reduction on prep   

## 2021-10-15 ENCOUNTER — Encounter: Payer: Self-pay | Admitting: Gastroenterology

## 2021-10-29 ENCOUNTER — Encounter: Payer: BC Managed Care – PPO | Admitting: Gastroenterology

## 2021-11-20 ENCOUNTER — Encounter: Payer: Self-pay | Admitting: Gastroenterology

## 2021-11-20 ENCOUNTER — Ambulatory Visit (AMBULATORY_SURGERY_CENTER): Payer: BC Managed Care – PPO | Admitting: Gastroenterology

## 2021-11-20 VITALS — BP 123/61 | HR 63 | Temp 98.6°F | Resp 16 | Ht 65.0 in | Wt 164.0 lb

## 2021-11-20 DIAGNOSIS — Z1211 Encounter for screening for malignant neoplasm of colon: Secondary | ICD-10-CM | POA: Diagnosis not present

## 2021-11-20 DIAGNOSIS — Z86010 Personal history of colonic polyps: Secondary | ICD-10-CM

## 2021-11-20 DIAGNOSIS — K635 Polyp of colon: Secondary | ICD-10-CM | POA: Diagnosis not present

## 2021-11-20 DIAGNOSIS — Z09 Encounter for follow-up examination after completed treatment for conditions other than malignant neoplasm: Secondary | ICD-10-CM | POA: Diagnosis not present

## 2021-11-20 DIAGNOSIS — D122 Benign neoplasm of ascending colon: Secondary | ICD-10-CM

## 2021-11-20 MED ORDER — SODIUM CHLORIDE 0.9 % IV SOLN: 500.0000 mL | Freq: Once | INTRAVENOUS | Status: DC

## 2021-11-20 NOTE — Patient Instructions (Addendum)
Resume previous diet. - Continue present medications. - Await pathology results. - Repeat colonoscopy in 5 years for surveillance.  YOU HAD AN ENDOSCOPIC PROCEDURE TODAY: Refer to the procedure report and other information in the discharge instructions given to you for any specific questions about what was found during the examination. If this information does not answer your questions, please call [place] office at [phone] to clarify.   YOU SHOULD EXPECT: Some feelings of bloating in the abdomen. Passage of more gas than usual. Walking can help get rid of the air that was put into your GI tract during the procedure and reduce the bloating. If you had a lower endoscopy (such as a colonoscopy or flexible sigmoidoscopy) you may notice spotting of blood in your stool or on the toilet paper. Some abdominal soreness may be present for a day or two, also.  DIET: Your first meal following the procedure should be a light meal and then it is ok to progress to your normal diet. A half-sandwich or bowl of soup is an example of a good first meal. Heavy or fried foods are harder to digest and may make you feel nauseous or bloated. Drink plenty of fluids but you should avoid alcoholic beverages for 24 hours. If you had a esophageal dilation, please see attached instructions for diet.    ACTIVITY: Your care partner should take you home directly after the procedure. You should plan to take it easy, moving slowly for the rest of the day. You can resume normal activity the day after the procedure however YOU SHOULD NOT DRIVE, use power tools, machinery or perform tasks that involve climbing or major physical exertion for 24 hours (because of the sedation medicines used during the test).   SYMPTOMS TO REPORT IMMEDIATELY: A gastroenterologist can be reached at any hour. Please call [phone]  for any of the following symptoms:  Following lower endoscopy (colonoscopy, flexible sigmoidoscopy) Excessive amounts of  blood in the stool  Significant tenderness, worsening of abdominal pains  Swelling of the abdomen that is new, acute  Fever of 100 or higher  FOLLOW UP:  If any biopsies were taken you will be contacted by phone or by letter within the next 1-3 weeks. Call [phone]  if you have not heard about the biopsies in 3 weeks.  Please also call with any specific questions about appointments or follow up tests.

## 2021-11-20 NOTE — Op Note (Signed)
[place] Patient Name: Carla Beltran Procedure Date: 11/20/2021 4:07 PM MRN: [id number] Endoscopist: Mallie Mussel L. Loletha Carrow , MD Age: 56 Referring MD:  Date of Birth: [birth year] Gender: Female Account #: [id number] Procedure:                Colonoscopy Indications:              High risk colon cancer surveillance: Personal                            history of sessile serrated colon polyp (10 mm or                            greater in size)                           87m cecal SSP w/o dysplasia on first screening                            exam Jan 2019 Medicines:                Monitored Anesthesia Care Procedure:                Pre-Anesthesia Assessment:                           - Prior to the procedure, a History and Physical                            was performed, and patient medications and                            allergies were reviewed. The patient's tolerance of                            previous anesthesia was also reviewed. The risks                            and benefits of the procedure and the sedation                            options and risks were discussed with the patient.                            All questions were answered, and informed consent                            was obtained. Prior Anticoagulants: The patient has                            taken no previous anticoagulant or antiplatelet                            agents. ASA Grade Assessment: II - A patient with  mild systemic disease. After reviewing the risks                            and benefits, the patient was deemed in                            satisfactory condition to undergo the procedure.                           After obtaining informed consent, the colonoscope                            was passed under direct vision. Throughout the                            procedure, the patient's blood pressure, pulse, and                            oxygen  saturations were monitored continuously. The                            Olympus CF-HQ190L [id number]) Colonoscope was                            introduced through the anus and advanced to the the                            cecum, identified by appendiceal orifice and                            ileocecal valve. The colonoscopy was performed                            without difficulty. The patient tolerated the                            procedure well. The quality of the bowel                            preparation was good. The ileocecal valve,                            appendiceal orifice, and rectum were photographed.                            The bowel preparation used was SUPREP. Scope In: 4:19:28 PM Scope Out: 4:34:24 PM Scope Withdrawal Time: 0 hours 10 minutes 11 seconds  Total Procedure Duration: 0 hours 14 minutes 56 seconds  Findings:                 The perianal and digital rectal examinations were                            normal.  A 6 mm polyp was found in the proximal ascending                            colon. The polyp was semi-sessile with a mucus cap.                            The polyp was removed with a cold snare. Resection                            and retrieval were complete.                           Repeat examination of right colon under NBI                            performed.                           Internal hemorrhoids were found. The hemorrhoids                            were small.                           The exam was otherwise without abnormality on                            direct and retroflexion views. Complications:            No immediate complications. Estimated Blood Loss:     Estimated blood loss was minimal. Impression:               - One 6 mm polyp in the proximal ascending colon,                            removed with a cold snare. Resected and retrieved.                           - Internal  hemorrhoids.                           - The examination was otherwise normal on direct                            and retroflexion views. Recommendation:           - Patient has a contact number available for                            emergencies. The signs and symptoms of potential                            delayed complications were discussed with the                            patient. Return to normal activities tomorrow.  Written discharge instructions were provided to the                            patient.                           - Resume previous diet.                           - Continue present medications.                           - Await pathology results.                           - Repeat colonoscopy in 5 years for surveillance. Lenoria Narine L. Loletha Carrow, MD 11/20/2021 4:37:39 PM This report has been signed electronically.

## 2021-11-20 NOTE — Progress Notes (Signed)
Vital signs checked by:DT  The medical and surgical history was reviewed and verified with the patient.  

## 2021-11-20 NOTE — Progress Notes (Unsigned)
PT taken to PACU. Monitors in place. VSS. Report given to RN. 

## 2021-11-20 NOTE — Progress Notes (Unsigned)
Called to room to assist during endoscopic procedure.  Patient ID and intended procedure confirmed with present staff. Received instructions for my participation in the procedure from the performing physician.  

## 2021-11-20 NOTE — Progress Notes (Unsigned)
History and Physical:  This patient presents for endoscopic testing for: Encounter Diagnosis  Name Primary?   Personal history of colonic polyps Yes    56 year old woman here for surveillance colonoscopy.  10 mm cecal SSP without dysplasia removed on first screening colonoscopy January 2019.  Patient is otherwise without complaints or active issues today.   Past Medical History: Past Medical History:  Diagnosis Date   Allergy    Anemia    Anxiety    Depression    Fatigue    Frequent UTI    Hyperlipidemia    Migraine    Reactive airway disease    patient unaware RAD   Renal stones    Rosacea    Vertigo    was on antivert     Past Surgical History: Past Surgical History:  Procedure Laterality Date   BREAST BIOPSY  1990   benign   BREAST EXCISIONAL BIOPSY Right 30+ yrs ago   benign   LASIK  2001   TONSILLECTOMY AND ADENOIDECTOMY  1985   WISDOM TOOTH EXTRACTION      Allergies: Allergies  Allergen Reactions   Codeine     Outpatient Meds: Current Outpatient Medications  Medication Sig Dispense Refill   escitalopram (LEXAPRO) 20 MG tablet Take 1 tablet (20 mg total) by mouth daily. 90 tablet 1   nitrofurantoin, macrocrystal-monohydrate, (MACROBID) 100 MG capsule Take 1 capsule (100 mg total) by mouth as needed. 1 capsule after intercourse to prevent UTI 90 capsule 1   Lactobacillus (PROBIOTIC ACIDOPHILUS PO) Take 1 capsule by mouth daily. (Patient not taking: Reported on 10/08/2021)     Omega-3 Fatty Acids (FISH OIL) 1000 MG CAPS Take 2 capsules by mouth daily. '4300mg'$  daily (Patient not taking: Reported on 10/08/2021)     OVER THE COUNTER MEDICATION Herbal supplements for brain and adrenal support (Patient not taking: Reported on 10/08/2021)     VITAMIN D PO Take 10,000 Units by mouth daily.      VITAMIN K PO Take by mouth. (Patient not taking: Reported on 10/08/2021)     Current Facility-Administered Medications  Medication Dose Route Frequency Provider Last Rate  Last Admin   0.9 %  sodium chloride infusion  500 mL Intravenous Once Danis, Estill Cotta III, MD       0.9 %  sodium chloride infusion  500 mL Intravenous Once Nelida Meuse III, MD          ___________________________________________________________________ Objective   Exam:  BP 132/77   Pulse 89   Temp 98.6 F (37 C)   Ht '5\' 5"'$  (1.651 m)   Wt 164 lb (74.4 kg)   LMP 12/31/2014   SpO2 96%   BMI 27.29 kg/m   CV: regular , S1/S2 Resp: clear to auscultation bilaterally, normal RR and effort noted GI: soft, no tenderness, with active bowel sounds.   Assessment: Encounter Diagnosis  Name Primary?   Personal history of colonic polyps Yes     Plan: Colonoscopy  The benefits and risks of the planned procedure were described in detail with the patient or (when appropriate) their health care proxy.  Risks were outlined as including, but not limited to, bleeding, infection, perforation, adverse medication reaction leading to cardiac or pulmonary decompensation, pancreatitis (if ERCP).  The limitation of incomplete mucosal visualization was also discussed.  No guarantees or warranties were given.    The patient is appropriate for an endoscopic procedure in the ambulatory setting.   - Wilfrid Lund, MD

## 2021-11-21 ENCOUNTER — Telehealth: Payer: Self-pay

## 2021-11-21 ENCOUNTER — Encounter: Payer: BC Managed Care – PPO | Admitting: Gastroenterology

## 2021-11-21 NOTE — Telephone Encounter (Signed)
  Follow up Call-     11/20/2021    3:06 PM  Call back number  Post procedure Call Back phone  # [phone]  Permission to leave phone message Yes     Patient questions:  Do you have a fever, pain , or abdominal swelling? No. Pain Score  0 *  Have you tolerated food without any problems? Yes.    Have you been able to return to your normal activities? Yes.    Do you have any questions about your discharge instructions: Diet   No. Medications  No. Follow up visit  No.  Do you have questions or concerns about your Care? No.  Actions: * If pain score is 4 or above: No action needed, pain <4.

## 2021-11-24 ENCOUNTER — Other Ambulatory Visit: Payer: Self-pay | Admitting: Family Medicine

## 2021-11-25 ENCOUNTER — Ambulatory Visit: Payer: BC Managed Care – PPO | Admitting: Family Medicine

## 2021-11-25 ENCOUNTER — Encounter: Payer: Self-pay | Admitting: Gastroenterology

## 2021-12-02 ENCOUNTER — Ambulatory Visit: Payer: BC Managed Care – PPO | Admitting: Family Medicine

## 2021-12-13 ENCOUNTER — Ambulatory Visit (INDEPENDENT_AMBULATORY_CARE_PROVIDER_SITE_OTHER): Payer: BC Managed Care – PPO | Admitting: Family Medicine

## 2021-12-13 ENCOUNTER — Telehealth: Payer: Self-pay

## 2021-12-13 ENCOUNTER — Encounter: Payer: Self-pay | Admitting: Family Medicine

## 2021-12-13 VITALS — BP 117/76 | HR 79 | Temp 98.7°F | Wt 161.2 lb

## 2021-12-13 DIAGNOSIS — N390 Urinary tract infection, site not specified: Secondary | ICD-10-CM | POA: Diagnosis not present

## 2021-12-13 DIAGNOSIS — F410 Panic disorder [episodic paroxysmal anxiety] without agoraphobia: Secondary | ICD-10-CM | POA: Diagnosis not present

## 2021-12-13 DIAGNOSIS — F330 Major depressive disorder, recurrent, mild: Secondary | ICD-10-CM | POA: Diagnosis not present

## 2021-12-13 DIAGNOSIS — F411 Generalized anxiety disorder: Secondary | ICD-10-CM

## 2021-12-13 MED ORDER — NITROFURANTOIN MONOHYD MACRO 100 MG PO CAPS: 100.0000 mg | ORAL_CAPSULE | ORAL | 1 refills | Status: DC | PRN

## 2021-12-13 MED ORDER — ESCITALOPRAM OXALATE 20 MG PO TABS: 20.0000 mg | ORAL_TABLET | Freq: Every day | ORAL | 1 refills | Status: DC

## 2021-12-13 NOTE — Telephone Encounter (Signed)
Rx resent to [facility]

## 2021-12-13 NOTE — Telephone Encounter (Signed)
Patient was seen today.  She noticed on her AVS that the 2 prescriptions were to be sent to [place], the AVS states they were sent [facility].  [facility] preferred [facility] per patient request so this does not happen again.  [facility] and [facility] to [facility].  Please advise patient when completed per patient request.  [phone]

## 2021-12-13 NOTE — Addendum Note (Signed)
Addended by: Beatrix Fetters on: 12/13/2021 10:17 AM   Modules accepted: Orders

## 2021-12-13 NOTE — Patient Instructions (Signed)
Return in about 24 weeks (around 05/30/2022) for Routine chronic condition follow-up.        Great to see you today.  I have refilled the medication(s) we provide.   If labs were collected, we will inform you of lab results once received either by echart message or telephone call.   - echart message- for normal results that have been seen by the patient already.   - telephone call: abnormal results or if patient has not viewed results in their echart.

## 2021-12-13 NOTE — Progress Notes (Signed)
Patient ID: Carla Beltran, female  DOB: 09-13-1965, 56 y.o.   MRN: [id number] Patient Care Team    Relationship Specialty Notifications Start End  [name], DO PCP - General Family Medicine  05/28/16   Doran Stabler, MD Consulting Physician Gastroenterology  03/13/17     Chief Complaint  Patient presents with   Anxiety    Pt is not fasting    Subjective: Carla Beltran is a 56 y.o.  Female  present for Carla Beltran Anxiety/phobia:  Pt reports she is feeling great  on lexapro 20 mg qd.  She is continuing to see her Marketing executive. Both her daughters have had their babies over the last few weeks and she is excited. She is now a grandmother of 3 granddaughters.  Original note on establishment: Patient presents for establishment today and admits to increased anxiety and panic attacks. She has fears that she has trouble controlling. She worries about everything. She has a strong fear of driving on the [place], especially in areas she is unfamiliar with. She endorses panic attacks that occur during driving and at other times. She states she experiences a increased heart rate and then feeling like she is "out of her body ". She reports increased anxiety in her life especially around times when she is moving, such as currently. She states she's always had anxiety. She reports she did not have a great childhood. Her mother now has dementia and is in [place] in a nursing facility, this also caused her increased anxiety. Her mother with hoarder. She has 2 daughters, which also are affected by anxiety and are on Celexa. She states her daughters are not affected by his much panic she is. She reports being on Zoloft since 2006. She was initially on 25 mg of Zoloft daily, and eventually increased up to Zoloft 50 mg daily. Prior to moving in August 2017 her prior provider told her to increase to 75 mg daily. She states that she did not like the apathetic side effects she experienced at 75 mg  dose, so she decreased back down to the 50 mg dose daily. She reports being seen by counselor many times in the past. She has seek the help of a Marketing executive in [place] Leanor Rubenstein). She has yet to establish with her.  Recurrent UTI; Reports she is still taking macrobid post coital and it has been effective. She reports her prescription is expired now.        12/13/2021    8:51 AM 06/12/2021    8:56 AM 07/24/2020    9:28 AM 06/12/2020    9:46 AM 01/04/2020    9:45 AM  Depression screen PHQ 2/9  Decreased Interest 0 0 1 3 0  Down, Depressed, Hopeless 0 0 1 3 0  PHQ - 2 Score 0 0 2 6 0  Altered sleeping 0 0 1 1 0  Tired, decreased energy 0 0 1 2 0  Change in appetite 0 0 1 3 0  Feeling bad or failure about yourself  0 0 0 2 0  Trouble concentrating 0 0 0 1 1  Moving slowly or fidgety/restless 0 0 0 2 0  Suicidal thoughts 0 0 0 0 0  PHQ-9 Score 0 0 '5 17 1      '$ 12/13/2021    8:52 AM 06/12/2021    8:56 AM 07/24/2020    9:28 AM 06/12/2020    9:47 AM  GAD 7 : Generalized Anxiety  Score  Nervous, Anxious, on Edge 0 0 1 3  Control/stop worrying 0 0 1 3  Worry too much - different things 0 0 1 3  Trouble relaxing 0 0 1 3  Restless 0 0 0 3  Easily annoyed or irritable 0 0 0 1  Afraid - awful might happen 0 0 0 1  Total GAD 7 Score 0 0 4 17    Immunization History  Administered Date(s) Administered   PFIZER(Purple Top)SARS-COV-2 Vaccination 06/09/2019, 07/16/2019   Tdap 10/26/2012   Zoster Recombinat (Shingrix) 09/16/2017, 11/26/2017     Past Medical History:  Diagnosis Date   Allergy    Anemia    Anxiety    Depression    Fatigue    Frequent UTI    Hyperlipidemia    Migraine    Reactive airway disease    patient unaware RAD   Renal stones    Rosacea    Vertigo    was on antivert   Allergies  Allergen Reactions   Codeine    Past Surgical History:  Procedure Laterality Date   BREAST BIOPSY  1990   benign   BREAST EXCISIONAL BIOPSY Right 30+ yrs ago    benign   LASIK  2001   TONSILLECTOMY AND ADENOIDECTOMY  1985   WISDOM TOOTH EXTRACTION     Family History  Problem Relation Age of Onset   Diabetes Mother    Mental illness Mother        27   Dementia Mother    Asthma Mother    Depression Mother    Migraines Mother    Colon polyps Father    Atrial fibrillation Father    Hypertension Father    Diabetes Maternal Aunt    Hearing loss Maternal Aunt    Stroke Maternal Grandmother    Arthritis Maternal Grandfather    Heart disease Maternal Grandfather    Colon polyps Maternal Grandfather    Colon polyps Paternal Grandfather    Colon cancer Neg Hx    Esophageal cancer Neg Hx    Rectal cancer Neg Hx    Stomach cancer Neg Hx    Social History   Social History Narrative   Married to [place]. Two adult children [name] and [name] (in college).    Moved from [facility]  area) 09/2015.    SAHM. College educated.    Drinks one cup of coffee a day.   Takes a daily vitamin.   Wears seatbelts, bicycle helmet and smoke detector in the home.   Tries to exercise routinely.   Feels safe in her relationships.    Allergies as of 12/13/2021       Reactions   Codeine         Medication List        Accurate as of December 13, 2021  9:14 AM. If you have any questions, ask your nurse or doctor.          STOP taking these medications    Fish Oil 1000 MG Caps Stopped by: Howard Pouch, DO   OVER THE COUNTER MEDICATION Stopped by: Howard Pouch, DO   PROBIOTIC ACIDOPHILUS PO Stopped by: Howard Pouch, DO   VITAMIN K PO Stopped by: Howard Pouch, DO       TAKE these medications    escitalopram 20 MG tablet Commonly known as: Lexapro Take 1 tablet (20 mg total) by mouth daily.   nitrofurantoin (macrocrystal-monohydrate) 100 MG capsule Commonly known as: MACROBID Take 1 capsule (100  mg total) by mouth as needed. 1 capsule after intercourse to prevent UTI   VITAMIN D PO Take 10,000 Units by mouth daily.         All past medical history, surgical history, allergies, family history, immunizations andmedications were updated in the EMR today and reviewed under the history and medication portions of their EMR.     ROS: 14 pt review of systems performed and negative (unless mentioned in an HPI)  Objective: BP 117/76   Pulse 79   Temp 98.7 F (37.1 C)   Wt 161 lb 3.2 oz (73.1 kg)   LMP 12/31/2014   SpO2 97%   BMI 26.83 kg/m  Physical Exam Vitals and nursing note reviewed.  Constitutional:      General: She is not in acute distress.    Appearance: Normal appearance. She is normal weight. She is not ill-appearing or toxic-appearing.  Eyes:     Extraocular Movements: Extraocular movements intact.     Conjunctiva/sclera: Conjunctivae normal.     Pupils: Pupils are equal, round, and reactive to light.  Neurological:     Mental Status: She is alert and oriented to person, place, and time. Mental status is at baseline.  Psychiatric:        Mood and Affect: Mood normal.        Behavior: Behavior normal.        Thought Content: Thought content normal.        Judgment: Judgment normal.     No results found.  Assessment/plan: JANARA KLETT is a 56 y.o. female present for [place] Beltran Generalized anxiety disorder/panic attacks/depression Stable Continue  lexapro to 20 mg qd.  - continue  Marketing executive. - F/U 5.5 mos.   Recurrent UTI: Stable Continue  macrobid 100 mg PRN post coital.   Meds ordered this encounter  Medications   escitalopram (LEXAPRO) 20 MG tablet    Sig: Take 1 tablet (20 mg total) by mouth daily.    Dispense:  90 tablet    Refill:  1   nitrofurantoin, macrocrystal-monohydrate, (MACROBID) 100 MG capsule    Sig: Take 1 capsule (100 mg total) by mouth as needed. 1 capsule after intercourse to prevent UTI    Dispense:  90 capsule    Refill:  1    Referral Orders  No referral(s) requested today    This visit occurred during the SARS-CoV-2 public health  emergency.  Safety protocols were in place, including screening questions prior to the visit, additional usage of staff PPE, and extensive cleaning of exam room while observing appropriate contact time as indicated for disinfecting solutions.    Electronically signed by: Howard Pouch, DO [place]

## 2021-12-16 ENCOUNTER — Ambulatory Visit (INDEPENDENT_AMBULATORY_CARE_PROVIDER_SITE_OTHER): Payer: BC Managed Care – PPO

## 2021-12-16 DIAGNOSIS — Z23 Encounter for immunization: Secondary | ICD-10-CM

## 2022-05-20 ENCOUNTER — Other Ambulatory Visit: Payer: Self-pay | Admitting: Family Medicine

## 2022-06-02 ENCOUNTER — Ambulatory Visit: Payer: BC Managed Care – PPO | Admitting: Family Medicine

## 2022-06-18 ENCOUNTER — Ambulatory Visit: Payer: BC Managed Care – PPO | Admitting: Family Medicine

## 2022-08-15 ENCOUNTER — Encounter: Payer: Self-pay | Admitting: Family Medicine

## 2022-08-15 ENCOUNTER — Ambulatory Visit (INDEPENDENT_AMBULATORY_CARE_PROVIDER_SITE_OTHER): Payer: BC Managed Care – PPO | Admitting: Family Medicine

## 2022-08-15 VITALS — BP 123/78 | HR 91 | Temp 97.5°F | Wt 173.8 lb

## 2022-08-15 DIAGNOSIS — F330 Major depressive disorder, recurrent, mild: Secondary | ICD-10-CM | POA: Diagnosis not present

## 2022-08-15 DIAGNOSIS — F410 Panic disorder [episodic paroxysmal anxiety] without agoraphobia: Secondary | ICD-10-CM

## 2022-08-15 DIAGNOSIS — F411 Generalized anxiety disorder: Secondary | ICD-10-CM

## 2022-08-15 MED ORDER — ESCITALOPRAM OXALATE 20 MG PO TABS: 20.0000 mg | ORAL_TABLET | Freq: Every day | ORAL | 1 refills | Status: DC

## 2022-08-15 NOTE — Patient Instructions (Addendum)
Return in about 24 weeks (around 01/30/2023) for cpe (20 min), Routine chronic condition follow-up.        Great to see you today.  I have refilled the medication(s) we provide.   If labs were collected, we will inform you of lab results once received either by echart message or telephone call.   - echart message- for normal results that have been seen by the patient already.   - telephone call: abnormal results or if patient has not viewed results in their echart.

## 2022-08-15 NOTE — Progress Notes (Signed)
Patient ID: Carla Beltran, female  DOB: Mar 17, 1965, 57 y.o.   MRN: [id number] Patient Care Team    Relationship Specialty Notifications Start End  [name], DO PCP - General Family Medicine  05/28/16   Sherrilyn Rist, MD Consulting Physician Gastroenterology  03/13/17     Chief Complaint  Patient presents with   Depression    Subjective: Carla Beltran is a 57 y.o.  Female  present for [facility] Anxiety/phobia:  Pt reports she feels  lexapro 20 mg qd.  She is continuing to see her Librarian, academic. Both her daughters have had their babies and she loves being a grandma to 3.  Original note on establishment: Patient presents for establishment today and admits to increased anxiety and panic attacks. She has fears that she has trouble controlling. She worries about everything. She has a strong fear of [place] on the [place], especially in areas she is unfamiliar with. She endorses panic attacks that occur during driving and at other times. She states she experiences a increased heart rate and then feeling like she is "out of her body ". She reports increased anxiety in her life especially around times when she is moving, such as currently. She states she's always had anxiety. She reports she did not have a great childhood. Her mother now has dementia and is in [place] in a nursing facility, this also caused her increased anxiety. Her mother with hoarder. She has 2 daughters, which also are affected by anxiety and are on Celexa. She states her daughters are not affected by his much panic she is. She reports being on Zoloft since 2006. She was initially on 25 mg of Zoloft daily, and eventually increased up to Zoloft 50 mg daily. Prior to moving in August 2017 her prior provider told her to increase to 75 mg daily. She states that she did not like the apathetic side effects she experienced at 75 mg dose, so she decreased back down to the 50 mg dose daily. She reports being seen by  counselor many times in the past. She has seek the help of a Librarian, academic in [place] Arletha Grippe). She has yet to establish with her.  Recurrent UTI; Reports she is still taking macrobid post coital and it has been effective. She reports her prescription is expired now.        08/15/2022    9:54 AM 12/13/2021    8:51 AM 06/12/2021    8:56 AM 07/24/2020    9:28 AM 06/12/2020    9:46 AM  Depression screen PHQ 2/9  Decreased Interest 0 0 0 1 3  Down, Depressed, Hopeless 0 0 0 1 3  PHQ - 2 Score 0 0 0 2 6  Altered sleeping 0 0 0 1 1  Tired, decreased energy 1 0 0 1 2  Change in appetite 0 0 0 1 3  Feeling bad or failure about yourself  0 0 0 0 2  Trouble concentrating 0 0 0 0 1  Moving slowly or fidgety/restless 0 0 0 0 2  Suicidal thoughts 0 0 0 0 0  PHQ-9 Score 1 0 0 5 17  Difficult doing work/chores Not difficult at all          08/15/2022    9:54 AM 12/13/2021    8:52 AM 06/12/2021    8:56 AM 07/24/2020    9:28 AM  GAD 7 : Generalized Anxiety Score  Nervous, Anxious, on Edge 0  0 0 1  Control/stop worrying 0 0 0 1  Worry too much - different things 0 0 0 1  Trouble relaxing 0 0 0 1  Restless 0 0 0 0  Easily annoyed or irritable 0 0 0 0  Afraid - awful might happen 0 0 0 0  Total GAD 7 Score 0 0 0 4  Anxiety Difficulty Not difficult at all       Immunization History  Administered Date(s) Administered   Influenza,inj,Quad PF,6+ Mos 12/16/2021   PFIZER(Purple Top)SARS-COV-2 Vaccination 06/09/2019, 07/16/2019   Tdap 10/26/2012   Zoster Recombinat (Shingrix) 09/16/2017, 11/26/2017     Past Medical History:  Diagnosis Date   Allergy    Anemia    Anxiety    Depression    Enlarged lymph nodes 06/12/2020   Fatigue    Frequent UTI    Hyperlipidemia    Migraine    Reactive airway disease    patient unaware RAD   Renal stones    Rosacea    Vertigo    was on antivert   Allergies  Allergen Reactions   Codeine    Past Surgical History:  Procedure  Laterality Date   BREAST BIOPSY  1990   benign   BREAST EXCISIONAL BIOPSY Right 30+ yrs ago   benign   LASIK  2001   TONSILLECTOMY AND ADENOIDECTOMY  1985   WISDOM TOOTH EXTRACTION     Family History  Problem Relation Age of Onset   Diabetes Mother    Mental illness Mother        Hoarder   Dementia Mother    Asthma Mother    Depression Mother    Migraines Mother    Colon polyps Father    Atrial fibrillation Father    Hypertension Father    Diabetes Maternal Aunt    Hearing loss Maternal Aunt    Stroke Maternal Grandmother    Arthritis Maternal Grandfather    Heart disease Maternal Grandfather    Colon polyps Maternal Grandfather    Colon polyps Paternal Grandfather    Colon cancer Neg Hx    Esophageal cancer Neg Hx    Rectal cancer Neg Hx    Stomach cancer Neg Hx    Social History   Social History Narrative   Married to [place]. Two adult children Alan Ripper and Johnny Bridge (in college).    Moved from [place] [facility]  area) 09/2015.    SAHM. College educated.    Drinks one cup of coffee a day.   Takes a daily vitamin.   Wears seatbelts, bicycle helmet and smoke detector in the home.   Tries to exercise routinely.   Feels safe in her relationships.    Allergies as of 08/15/2022       Reactions   Codeine         Medication List        Accurate as of August 15, 2022 10:26 AM. If you have any questions, ask your nurse or doctor.          escitalopram 20 MG tablet Commonly known as: Lexapro Take 1 tablet (20 mg total) by mouth daily.   nitrofurantoin (macrocrystal-monohydrate) 100 MG capsule Commonly known as: MACROBID Take 1 capsule (100 mg total) by mouth as needed. 1 capsule after intercourse to prevent UTI   VITAMIN D PO Take 10,000 Units by mouth daily.        All past medical history, surgical history, allergies, family history, immunizations andmedications were updated in the  EMR today and reviewed under the history and medication portions of their  EMR.     ROS: 14 pt review of systems performed and negative (unless mentioned in an HPI)  Objective: BP 123/78   Pulse 91   Temp (!) 97.5 F (36.4 C)   Wt 173 lb 12.8 oz (78.8 kg)   LMP 12/31/2014   SpO2 95%   BMI 28.92 kg/m  Physical Exam Vitals and nursing note reviewed.  Constitutional:      General: She is not in acute distress.    Appearance: Normal appearance. She is normal weight. She is not ill-appearing or toxic-appearing.  Eyes:     Extraocular Movements: Extraocular movements intact.     Conjunctiva/sclera: Conjunctivae normal.     Pupils: Pupils are equal, round, and reactive to light.  Neurological:     Mental Status: She is alert and oriented to person, place, and time. Mental status is at baseline.  Psychiatric:        Mood and Affect: Mood normal.        Behavior: Behavior normal.        Thought Content: Thought content normal.        Judgment: Judgment normal.     No results found.  Assessment/plan: Carla Beltran is a 57 y.o. female present for [facility] Generalized anxiety disorder/panic attacks/depression Stable Continue lexapro to 20 mg qd.  - continue  Librarian, academic. - F/U 5.5 mos.   Recurrent UTI: Stable Continue macrobid 100 mg PRN post coital. (Does not need refills today)  Meds ordered this encounter  Medications   escitalopram (LEXAPRO) 20 MG tablet    Sig: Take 1 tablet (20 mg total) by mouth daily.    Dispense:  90 tablet    Refill:  1    Referral Orders  No referral(s) requested today    This visit occurred during the SARS-CoV-2 public health emergency.  Safety protocols were in place, including screening questions prior to the visit, additional usage of staff PPE, and extensive cleaning of exam room while observing appropriate contact time as indicated for disinfecting solutions.    Electronically signed by: Felix Pacini, DO [facility] [place]

## 2022-11-24 ENCOUNTER — Other Ambulatory Visit: Payer: Self-pay | Admitting: Family Medicine

## 2022-11-24 DIAGNOSIS — Z1231 Encounter for screening mammogram for malignant neoplasm of breast: Secondary | ICD-10-CM

## 2022-12-24 ENCOUNTER — Ambulatory Visit
Admission: RE | Admit: 2022-12-24 | Discharge: 2022-12-24 | Disposition: A | Discharge status: Home / Self Care | Payer: BC Managed Care – PPO | Source: Ambulatory Visit | Attending: Family Medicine | Admitting: Family Medicine

## 2022-12-24 DIAGNOSIS — Z1231 Encounter for screening mammogram for malignant neoplasm of breast: Secondary | ICD-10-CM

## 2023-01-03 ENCOUNTER — Other Ambulatory Visit: Payer: Self-pay | Admitting: Family Medicine

## 2023-01-27 ENCOUNTER — Ambulatory Visit: Payer: BC Managed Care – PPO | Admitting: Urgent Care

## 2023-01-27 ENCOUNTER — Other Ambulatory Visit: Payer: Self-pay

## 2023-01-27 ENCOUNTER — Encounter: Payer: Self-pay | Admitting: Urgent Care

## 2023-01-27 VITALS — BP 135/79 | HR 91 | Temp 98.9°F | Wt 174.0 lb

## 2023-01-27 DIAGNOSIS — J069 Acute upper respiratory infection, unspecified: Secondary | ICD-10-CM | POA: Diagnosis not present

## 2023-01-27 MED ORDER — ESCITALOPRAM OXALATE 20 MG PO TABS: 20.0000 mg | ORAL_TABLET | Freq: Every day | ORAL | 0 refills | Status: DC

## 2023-01-27 NOTE — Patient Instructions (Addendum)
Your symptoms appear to be viral.  Please take 600mg  mucinex twice daily with plenty of water. Consider a vaporizer over your bed at night to help with congestion. Steam from a shower can be beneficial.  Topical Vicks vaporub can provide soothing relief.  We discussed possible use of these three medications. Notify us if you decide you do want to try one: Atrovent nasal spray - to help with drainage leading to the cough Montelukast - a medication taken at night to help with lower respiratory congestion Benzonatate - a cough medication used as needed for bothersome cough.

## 2023-01-27 NOTE — Progress Notes (Signed)
Established Patient Office Visit  Subjective:  Patient ID: Carla Beltran, female    DOB: 1966-02-10  Age: 57 y.o. MRN: [id number]  Chief Complaint  Patient presents with   Cough    Chest congestion since thanksgiving along with fatigue. She did have some nasal congestion and pressure but it has gotten better..    [name] with a history of mild pollen allergies, has been experiencing symptoms of an upper respiratory infection since the Sunday before Thanksgiving, approximately 17 days ago. The illness began with a sore throat and progressed to include head congestion, cough, and sinus pressure. The patient reports that the sinus pressure has since resolved, and the cough has transitioned from productive to a dry, tickling sensation. The cough is reportedly worse at night, but does not cause chest pain or tightness. The patient denies any difficulty breathing through the nose and reports that she can taste and smell normally.  The patient has not been taking any over-the-counter medications for her symptoms. She reports feeling winded during a recent walk, which is unusual for her, and also noted increased fatigue after a Pilates session. Despite these symptoms, the patient has not experienced any decrease in physical activity levels.  The patient has been in close contact with her grandchildren, some of whom have been diagnosed with pneumonia and others with symptoms of congestion and productive coughs. The patient denies any fever or other symptoms consistent with pneumonia.  The patient prefers not to take any medications for symptom management at this time, but is open to reconsidering if symptoms persist or worsen in the coming days. She has been using honey and lemon in addition to steam from the shower.   Cough    Patient Active Problem List   Diagnosis Date Noted   Vitamin D deficiency 06/12/2020   Rosacea 06/12/2020   Migraine without aura, not refractory 06/12/2020    Mild  depression (HCC) 06/12/2020   Extrinsic asthma without status asthmaticus 06/12/2020   Diffuse cystic mastopathy 06/12/2020   Atrophic vaginitis 05/24/2019   Statin declined 03/17/2018   Colon polyp 03/13/2017   Generalized anxiety disorder 06/25/2016   Panic attacks 05/28/2016   Frequent UTI    Hyperlipidemia    Past Medical History:  Diagnosis Date   Allergy    Anemia    Anxiety    Depression    Enlarged lymph nodes 06/12/2020   Fatigue    Frequent UTI    Hyperlipidemia    Migraine    Reactive airway disease    patient unaware RAD   Renal stones    Rosacea    Vertigo    was on antivert   Social History   Tobacco Use   Smoking status: Never   Smokeless tobacco: Never  Vaping Use   Vaping status: Never Used  Substance Use Topics   Alcohol use: No   Drug use: No      ROS: as noted in HPI  Objective:     BP 135/79   Pulse 91   Temp 98.9 F (37.2 C) (Oral)   Wt 174 lb (78.9 kg)   LMP 12/31/2014   SpO2 95%   BMI 28.96 kg/m  BP Readings from Last 3 Encounters:  01/27/23 135/79  08/15/22 123/78  12/13/21 117/76   Wt Readings from Last 3 Encounters:  01/27/23 174 lb (78.9 kg)  08/15/22 173 lb 12.8 oz (78.8 kg)  12/13/21 161 lb 3.2 oz (73.1 kg)  Physical Exam Vitals and nursing note reviewed.  Constitutional:      General: She is not in acute distress.    Appearance: Normal appearance. She is normal weight. She is not ill-appearing, toxic-appearing or diaphoretic.  HENT:     Head: Normocephalic and atraumatic.     Right Ear: Tympanic membrane, ear canal and external ear normal. There is no impacted cerumen.     Left Ear: Tympanic membrane, ear canal and external ear normal. There is no impacted cerumen.     Nose: Nose normal. No congestion or rhinorrhea.     Mouth/Throat:     Mouth: Mucous membranes are moist.     Pharynx: Oropharynx is clear. No oropharyngeal exudate or posterior oropharyngeal erythema.  Eyes:     General: No  scleral icterus.       Right eye: No discharge.        Left eye: No discharge.     Extraocular Movements: Extraocular movements intact.     Pupils: Pupils are equal, round, and reactive to light.  Cardiovascular:     Rate and Rhythm: Normal rate and regular rhythm.     Pulses: Normal pulses.     Heart sounds: No murmur heard. Pulmonary:     Effort: Pulmonary effort is normal. No respiratory distress.     Breath sounds: Normal breath sounds. No stridor. No wheezing, rhonchi or rales.  Chest:     Chest wall: No tenderness.  Musculoskeletal:     Cervical back: Normal range of motion and neck supple. No rigidity or tenderness.  Lymphadenopathy:     Cervical: No cervical adenopathy.  Skin:    General: Skin is warm and dry.     Coloration: Skin is not jaundiced.     Findings: No bruising, erythema or rash.  Neurological:     General: No focal deficit present.     Mental Status: She is alert.     Sensory: No sensory deficit.     Motor: No weakness.  Psychiatric:        Mood and Affect: Mood normal.        Behavior: Behavior normal.      No results found for any visits on 01/27/23.  Last CBC Lab Results  Component Value Date   WBC 4.3 01/20/2019   HGB 12.7 01/20/2019   HCT 37.9 01/20/2019   MCV 92.2 01/20/2019   RDW 13.1 01/20/2019   PLT 262.0 01/20/2019   Last metabolic panel Lab Results  Component Value Date   GLUCOSE 129 (H) 01/20/2019   NA 139 01/20/2019   K 4.0 01/20/2019   CL 105 01/20/2019   CO2 26 01/20/2019   BUN 10 01/20/2019   CREATININE 0.65 01/20/2019   GFR 95.03 01/20/2019   CALCIUM 9.1 01/20/2019   PROT 5.9 (L) 01/20/2019   ALBUMIN 4.2 01/20/2019   BILITOT 0.6 01/20/2019   ALKPHOS 60 01/20/2019   AST 23 01/20/2019   ALT 18 01/20/2019      The ASCVD Risk score (Arnett DK, et al., 2019) failed to calculate for the following reasons:   Cannot find a previous HDL lab   Cannot find a previous total cholesterol lab   Unable to determine if  patient is Non-Hispanic African American  Assessment & Plan:  Viral upper respiratory tract infection with cough  Assessment & Plan Upper Respiratory Infection Persistent symptoms for 17 days with initial sore throat, sinus congestion, and productive cough, now transitioned to dry cough and overall stated improvement in  symptoms. No fever, chest pain, or wheezing. No significant ear pain or pressure. No current sinus pressure. No difficulty with smell or taste. No significant pulmonary or allergy history. -Continue supportive care with hydration, rest, and local honey. -Consider over-the-counter options such as Vicks VapoRub, humidification, and Mucinex for symptomatic relief. -Contact office if symptoms persist or worsen over the next 3-4 days for potential prescription symptom management.   No follow-ups on file.   Maretta Bees, PA

## 2023-02-10 ENCOUNTER — Encounter: Payer: BC Managed Care – PPO | Admitting: Family Medicine

## 2023-03-11 ENCOUNTER — Ambulatory Visit: Payer: BC Managed Care – PPO | Admitting: Family Medicine

## 2023-03-11 ENCOUNTER — Encounter: Payer: Self-pay | Admitting: Family Medicine

## 2023-03-11 VITALS — BP 112/76 | HR 103 | Temp 97.7°F | Ht 65.0 in | Wt 173.6 lb

## 2023-03-11 DIAGNOSIS — F330 Major depressive disorder, recurrent, mild: Secondary | ICD-10-CM

## 2023-03-11 DIAGNOSIS — F410 Panic disorder [episodic paroxysmal anxiety] without agoraphobia: Secondary | ICD-10-CM

## 2023-03-11 DIAGNOSIS — Z0000 Encounter for general adult medical examination without abnormal findings: Secondary | ICD-10-CM

## 2023-03-11 DIAGNOSIS — Z23 Encounter for immunization: Secondary | ICD-10-CM | POA: Diagnosis not present

## 2023-03-11 DIAGNOSIS — E559 Vitamin D deficiency, unspecified: Secondary | ICD-10-CM | POA: Diagnosis not present

## 2023-03-11 DIAGNOSIS — E2839 Other primary ovarian failure: Secondary | ICD-10-CM

## 2023-03-11 DIAGNOSIS — Z5320 Procedure and treatment not carried out because of patient's decision for unspecified reasons: Secondary | ICD-10-CM

## 2023-03-11 DIAGNOSIS — E782 Mixed hyperlipidemia: Secondary | ICD-10-CM

## 2023-03-11 DIAGNOSIS — Z1159 Encounter for screening for other viral diseases: Secondary | ICD-10-CM | POA: Diagnosis not present

## 2023-03-11 DIAGNOSIS — F411 Generalized anxiety disorder: Secondary | ICD-10-CM

## 2023-03-11 DIAGNOSIS — N390 Urinary tract infection, site not specified: Secondary | ICD-10-CM

## 2023-03-11 DIAGNOSIS — Z1231 Encounter for screening mammogram for malignant neoplasm of breast: Secondary | ICD-10-CM

## 2023-03-11 DIAGNOSIS — Z131 Encounter for screening for diabetes mellitus: Secondary | ICD-10-CM

## 2023-03-11 LAB — CBC
HCT: 41.7 % (ref 36.0–46.0)
Hemoglobin: 13.8 g/dL (ref 12.0–15.0)
MCHC: 33 g/dL (ref 30.0–36.0)
MCV: 92 fL (ref 78.0–100.0)
Platelets: 322 10*3/uL (ref 150.0–400.0)
RBC: 4.53 Mil/uL (ref 3.87–5.11)
RDW: 13.2 % (ref 11.5–15.5)
WBC: 5.1 10*3/uL (ref 4.0–10.5)

## 2023-03-11 LAB — LIPID PANEL
Cholesterol: 326 mg/dL — ABNORMAL HIGH (ref 0–200)
HDL: 51.5 mg/dL (ref >39.00)
LDL Cholesterol: 242 mg/dL — ABNORMAL HIGH (ref 0–99)
NonHDL: 274.89
Total CHOL/HDL Ratio: 6
Triglycerides: 166 mg/dL — ABNORMAL HIGH (ref 0.0–149.0)
VLDL: 33.2 mg/dL (ref 0.0–40.0)

## 2023-03-11 LAB — HEMOGLOBIN A1C: Hgb A1c MFr Bld: 5.5 % (ref 4.6–6.5)

## 2023-03-11 LAB — COMPREHENSIVE METABOLIC PANEL
ALT: 23 U/L (ref 0–35)
AST: 21 U/L (ref 0–37)
Albumin: 4.6 g/dL (ref 3.5–5.2)
Alkaline Phosphatase: 72 U/L (ref 39–117)
BUN: 11 mg/dL (ref 6–23)
CO2: 28 meq/L (ref 19–32)
Calcium: 9.4 mg/dL (ref 8.4–10.5)
Chloride: 103 meq/L (ref 96–112)
Creatinine, Ser: 0.66 mg/dL (ref 0.40–1.20)
GFR: 96.96 mL/min (ref >60.00)
Glucose, Bld: 102 mg/dL — ABNORMAL HIGH (ref 70–99)
Potassium: 4.5 meq/L (ref 3.5–5.1)
Sodium: 139 meq/L (ref 135–145)
Total Bilirubin: 0.5 mg/dL (ref 0.2–1.2)
Total Protein: 6.6 g/dL (ref 6.0–8.3)

## 2023-03-11 LAB — TSH: TSH: 3.42 u[IU]/mL (ref 0.35–5.50)

## 2023-03-11 LAB — VITAMIN D 25 HYDROXY (VIT D DEFICIENCY, FRACTURES): VITD: 24.75 ng/mL — ABNORMAL LOW (ref 30.00–100.00)

## 2023-03-11 MED ORDER — ESCITALOPRAM OXALATE 20 MG PO TABS: 20.0000 mg | ORAL_TABLET | Freq: Every day | ORAL | 1 refills | Status: DC

## 2023-03-11 MED ORDER — NITROFURANTOIN MONOHYD MACRO 100 MG PO CAPS: 100.0000 mg | ORAL_CAPSULE | ORAL | 1 refills | Status: DC | PRN

## 2023-03-11 NOTE — Patient Instructions (Addendum)

## 2023-03-11 NOTE — Progress Notes (Signed)
Patient ID: Carla Beltran, female  DOB: Nov 22, 1965, 58 y.o.   MRN: [id number] Patient Care Team    Relationship Specialty Notifications Start End  Carla Leatherwood, DO PCP - General Family Medicine  05/28/16   Carla Rist, MD Consulting Physician Gastroenterology  03/13/17     Chief Complaint  Patient presents with   Annual Exam    Pt is fasting; pt would like to wait for tdap    Subjective: Carla Beltran is a 58 y.o.  Female  present for annual physical and chronic condition management combined appointment.  All past medical history/family history was updated today appropriate    Health maintenance:    Hyperlipidemia/overweight/declined statin: She has had elevated LDL in the past, last labs 2020-ldl 187.  She has declined all medications to treat cholesterol.  She has been counseled on the benefits of statins.  She has a family history of stroke.     Anxiety/phobia:  Pt reports compliance with lexapro 20 mg qd.  She is continuing to see her Librarian, academic. Both her daughters have had their babies and she loves being a grandma to 3.  She feels medication is working well for her. Original note on establishment: Patient presents for establishment today and admits to increased anxiety and panic attacks. She has fears that she has trouble controlling. She worries about everything. She has a strong fear of driving on the [place], especially in areas she is unfamiliar with. She endorses panic attacks that occur during driving and at other times. She states she experiences a increased heart rate and then feeling like she is "out of her body ". She reports increased anxiety in her life especially around times when she is moving, such as currently. She states she's always had anxiety. She reports she did not have a great childhood. Her mother now has dementia and is in [place] in a nursing facility, this also caused her increased anxiety. Her mother with hoarder. She has  2 daughters, which also are affected by anxiety and are on Celexa. She states her daughters are not affected by his much panic she is. She reports being on Zoloft since 2006. She was initially on 25 mg of Zoloft daily, and eventually increased up to Zoloft 50 mg daily. Prior to moving in August 2017 her prior provider told her to increase to 75 mg daily. She states that she did not like the apathetic side effects she experienced at 75 mg dose, so she decreased back down to the 50 mg dose daily. She reports being seen by counselor many times in the past. She has seek the help of a Librarian, academic in [place]). She has yet to establish with her.  Recurrent UTI; Reports she is still taking macrobid post coital, medication has been effective on preventing UTI.       03/11/2023    8:01 AM 08/15/2022    9:54 AM 12/13/2021    8:51 AM 06/12/2021    8:56 AM 07/24/2020    9:28 AM  Depression screen PHQ 2/9  Decreased Interest 0 0 0 0 1  Down, Depressed, Hopeless 0 0 0 0 1  PHQ - 2 Score 0 0 0 0 2  Altered sleeping 0 0 0 0 1  Tired, decreased energy 0 1 0 0 1  Change in appetite 0 0 0 0 1  Feeling bad or failure about yourself  0 0 0 0 0  Trouble concentrating  0 0 0 0 0  Moving slowly or fidgety/restless 0 0 0 0 0  Suicidal thoughts 0 0 0 0 0  PHQ-9 Score 0 1 0 0 5  Difficult doing work/chores Not difficult at all Not difficult at all         03/11/2023    8:01 AM 08/15/2022    9:54 AM 12/13/2021    8:52 AM 06/12/2021    8:56 AM  GAD 7 : Generalized Anxiety Score  Nervous, Anxious, on Edge 0 0 0 0  Control/stop worrying 0 0 0 0  Worry too much - different things 0 0 0 0  Trouble relaxing 0 0 0 0  Restless 0 0 0 0  Easily annoyed or irritable 0 0 0 0  Afraid - awful might happen 0 0 0 0  Total GAD 7 Score 0 0 0 0  Anxiety Difficulty Not difficult at all Not difficult at all      Immunization History  Administered Date(s) Administered   Influenza,inj,Quad PF,6+ Mos  12/16/2021   PFIZER(Purple Top)SARS-COV-2 Vaccination 06/09/2019, 07/16/2019   Tdap 10/26/2012   Zoster Recombinant(Shingrix) 09/16/2017, 11/26/2017     Past Medical History:  Diagnosis Date   Allergy    Anemia    Anxiety    Depression    Enlarged lymph nodes 06/12/2020   Fatigue    Frequent UTI    Hyperlipidemia    Migraine    Reactive airway disease    patient unaware RAD   Renal stones    Rosacea    Vertigo    was on antivert   Allergies  Allergen Reactions   Codeine    Past Surgical History:  Procedure Laterality Date   BREAST BIOPSY  1990   benign   BREAST EXCISIONAL BIOPSY Right 30+ yrs ago   benign   LASIK  2001   TONSILLECTOMY AND ADENOIDECTOMY  1985   WISDOM TOOTH EXTRACTION     Family History  Problem Relation Age of Onset   Diabetes Mother    Mental illness Mother        Hoarder   Dementia Mother    Asthma Mother    Depression Mother    Migraines Mother    Colon polyps Father    Atrial fibrillation Father    Hypertension Father    Diabetes Maternal Aunt    Hearing loss Maternal Aunt    Stroke Maternal Grandmother    Arthritis Maternal Grandfather    Heart disease Maternal Grandfather    Colon polyps Maternal Grandfather    Colon polyps Paternal Grandfather    Colon cancer Neg Hx    Esophageal cancer Neg Hx    Rectal cancer Neg Hx    Stomach cancer Neg Hx    Social History   Social History Narrative   Married to [place]. Two adult children Carla Beltran and Carla Beltran (in college).    Moved from [place] [facility]  area) 09/2015.    SAHM. College educated.    Drinks one cup of coffee a day.   Takes a daily vitamin.   Wears seatbelts, bicycle helmet and smoke detector in the home.   Tries to exercise routinely.   Feels safe in her relationships.    Allergies as of 03/11/2023       Reactions   Codeine         Medication List        Accurate as of March 11, 2023  8:17 AM. If you have any questions, ask  your nurse or doctor.           escitalopram 20 MG tablet Commonly known as: Lexapro Take 1 tablet (20 mg total) by mouth daily.   nitrofurantoin (macrocrystal-monohydrate) 100 MG capsule Commonly known as: MACROBID Take 1 capsule (100 mg total) by mouth as needed. 1 capsule after intercourse to prevent UTI   VITAMIN D PO Take 10,000 Units by mouth daily.        All past medical history, surgical history, allergies, family history, immunizations andmedications were updated in the EMR today and reviewed under the history and medication portions of their EMR.     ROS: 14 pt review of systems performed and negative (unless mentioned in an HPI)  Objective: BP 112/76   Pulse (!) 103   Temp 97.7 F (36.5 C)   Ht 5\' 5"  (1.651 m)   Wt 173 lb 9.6 oz (78.7 kg)   LMP 12/31/2014   SpO2 98%   BMI 28.89 kg/m  Physical Exam Vitals and nursing note reviewed.  Constitutional:      General: She is not in acute distress.    Appearance: Normal appearance. She is not ill-appearing or toxic-appearing.  HENT:     Head: Normocephalic and atraumatic.     Right Ear: Tympanic membrane, ear canal and external ear normal. There is no impacted cerumen.     Left Ear: Tympanic membrane, ear canal and external ear normal. There is no impacted cerumen.     Nose: No congestion or rhinorrhea.     Mouth/Throat:     Mouth: Mucous membranes are moist.     Pharynx: Oropharynx is clear. No oropharyngeal exudate or posterior oropharyngeal erythema.  Eyes:     General: No scleral icterus.       Right eye: No discharge.        Left eye: No discharge.     Extraocular Movements: Extraocular movements intact.     Conjunctiva/sclera: Conjunctivae normal.     Pupils: Pupils are equal, round, and reactive to light.  Cardiovascular:     Rate and Rhythm: Normal rate and regular rhythm.     Pulses: Normal pulses.     Heart sounds: Normal heart sounds. No murmur heard.    No friction rub. No gallop.  Pulmonary:     Effort: Pulmonary effort  is normal. No respiratory distress.     Breath sounds: Normal breath sounds. No stridor. No wheezing, rhonchi or rales.  Chest:     Chest wall: No tenderness.  Abdominal:     General: Abdomen is flat. Bowel sounds are normal. There is no distension.     Palpations: Abdomen is soft. There is no mass.     Tenderness: There is no abdominal tenderness. There is no right CVA tenderness, left CVA tenderness, guarding or rebound.     Hernia: No hernia is present.  Musculoskeletal:        General: No swelling, tenderness or deformity. Normal range of motion.     Cervical back: Normal range of motion and neck supple. No rigidity or tenderness.     Right lower leg: No edema.     Left lower leg: No edema.  Lymphadenopathy:     Cervical: No cervical adenopathy.  Skin:    General: Skin is warm and dry.     Coloration: Skin is not jaundiced or pale.     Findings: No bruising, erythema, lesion or rash.  Neurological:     General: No focal deficit present.  Mental Status: She is alert and oriented to person, place, and time. Mental status is at baseline.     Cranial Nerves: No cranial nerve deficit.     Sensory: No sensory deficit.     Motor: No weakness.     Coordination: Coordination normal.     Gait: Gait normal.     Deep Tendon Reflexes: Reflexes normal.  Psychiatric:        Mood and Affect: Mood normal.        Behavior: Behavior normal.        Thought Content: Thought content normal.        Judgment: Judgment normal.     No results found.  Assessment/plan: Carla Beltran is a 58 y.o. female present for CPE/annual physical with combined chronic condition management Generalized anxiety disorder/panic attacks/depression Stable Continue lexapro to 20 mg qd.  - continue  Librarian, academic.  Recurrent UTI: Stable Continue macrobid 100 mg PRN post coital.   Influenza vaccine needed declined Breast cancer screening by mammogram - MM 3D SCREENING MAMMOGRAM BILATERAL BREAST;  Future Need for Tdap vaccination Administered today Estrogen deficiency - DG Bone Density; Future Diabetes mellitus screening - Hemoglobin A1c Vitamin D deficiency DEXA screening ordered - Vitamin D (25 hydroxy) Mixed hyperlipidemia - Hemoglobin A1c - Lipid panel  Routine general medical examination at a health care facility (Primary) - CBC - Comprehensive metabolic panel - TSH Patient was encouraged to exercise greater than 150 minutes a week. Patient was encouraged to choose a diet filled with fresh fruits and vegetables, and lean meats. AVS provided to patient today for education/recommendation on gender specific heColonoscopy:  Completed 11/2021-Dr. Danis; polyps, 5 year follow up. Mammogram: completed: 11/24-[facility]>ordered Cervical cancer screening: last pap: 05/2019- WNL/neg HPV - Dr.Zully Frane-57yr Immunizations: tdap updated today, Influenza declined(encouraged yearly). Shingrix series completed.  Infectious disease screening: HIV completed 1998, hep C declined DEXA: postmenopausal > ordered todayalth and safety maintenance.   Return in about 24 weeks (around 08/26/2023) for Routine chronic condition follow-up.  Meds ordered this encounter  Medications   nitrofurantoin, macrocrystal-monohydrate, (MACROBID) 100 MG capsule    Sig: Take 1 capsule (100 mg total) by mouth as needed. 1 capsule after intercourse to prevent UTI    Dispense:  90 capsule    Refill:  1   escitalopram (LEXAPRO) 20 MG tablet    Sig: Take 1 tablet (20 mg total) by mouth daily.    Dispense:  90 tablet    Refill:  1    Referral Orders  No referral(s) requested today     Electronically signed by: Felix Pacini, DO  [facility] [place]

## 2023-03-12 ENCOUNTER — Encounter: Payer: Self-pay | Admitting: Family Medicine

## 2023-03-12 LAB — HEPATITIS C ANTIBODY: Hepatitis C Ab: NONREACTIVE

## 2023-03-13 ENCOUNTER — Other Ambulatory Visit: Payer: Self-pay

## 2023-03-13 MED ORDER — EZETIMIBE 10 MG PO TABS: 10.0000 mg | ORAL_TABLET | Freq: Every day | ORAL | 3 refills | Status: DC

## 2023-03-13 NOTE — Telephone Encounter (Signed)
Please inform patient I have called in the Zetia for her to take once daily.  This can be taken in the morning or night. Follow-up in 3 months with provider, and come fasting-we will recheck her cholesterol during that visit

## 2023-03-25 NOTE — Telephone Encounter (Signed)
 CSR- Janell has removed patient charge.

## 2023-06-10 ENCOUNTER — Ambulatory Visit: Payer: BC Managed Care – PPO | Admitting: Family Medicine

## 2023-07-03 ENCOUNTER — Encounter: Payer: Self-pay | Admitting: Family Medicine

## 2023-07-03 ENCOUNTER — Ambulatory Visit (INDEPENDENT_AMBULATORY_CARE_PROVIDER_SITE_OTHER): Admitting: Family Medicine

## 2023-07-03 VITALS — BP 118/72 | HR 72 | Temp 98.0°F | Wt 159.0 lb

## 2023-07-03 DIAGNOSIS — F410 Panic disorder [episodic paroxysmal anxiety] without agoraphobia: Secondary | ICD-10-CM | POA: Diagnosis not present

## 2023-07-03 DIAGNOSIS — F411 Generalized anxiety disorder: Secondary | ICD-10-CM

## 2023-07-03 DIAGNOSIS — Z8249 Family history of ischemic heart disease and other diseases of the circulatory system: Secondary | ICD-10-CM

## 2023-07-03 DIAGNOSIS — N390 Urinary tract infection, site not specified: Secondary | ICD-10-CM | POA: Diagnosis not present

## 2023-07-03 DIAGNOSIS — E782 Mixed hyperlipidemia: Secondary | ICD-10-CM

## 2023-07-03 DIAGNOSIS — Z823 Family history of stroke: Secondary | ICD-10-CM | POA: Insufficient documentation

## 2023-07-03 DIAGNOSIS — Z5320 Procedure and treatment not carried out because of patient's decision for unspecified reasons: Secondary | ICD-10-CM

## 2023-07-03 DIAGNOSIS — E559 Vitamin D deficiency, unspecified: Secondary | ICD-10-CM

## 2023-07-03 DIAGNOSIS — F330 Major depressive disorder, recurrent, mild: Secondary | ICD-10-CM

## 2023-07-03 LAB — HEPATIC FUNCTION PANEL
ALT: 21 U/L (ref 0–35)
AST: 22 U/L (ref 0–37)
Albumin: 4.4 g/dL (ref 3.5–5.2)
Alkaline Phosphatase: 64 U/L (ref 39–117)
Bilirubin, Direct: 0.1 mg/dL (ref 0.0–0.3)
Total Bilirubin: 0.6 mg/dL (ref 0.2–1.2)
Total Protein: 6.2 g/dL (ref 6.0–8.3)

## 2023-07-03 LAB — LIPID PANEL
Cholesterol: 223 mg/dL — ABNORMAL HIGH (ref 0–200)
HDL: 59.1 mg/dL (ref >39.00)
LDL Cholesterol: 150 mg/dL — ABNORMAL HIGH (ref 0–99)
NonHDL: 164.34
Total CHOL/HDL Ratio: 4
Triglycerides: 71 mg/dL (ref 0.0–149.0)
VLDL: 14.2 mg/dL (ref 0.0–40.0)

## 2023-07-03 MED ORDER — EZETIMIBE 10 MG PO TABS: 10.0000 mg | ORAL_TABLET | Freq: Every day | ORAL | 3 refills | Status: DC

## 2023-07-03 MED ORDER — ESCITALOPRAM OXALATE 20 MG PO TABS
20.0000 mg | ORAL_TABLET | Freq: Every day | ORAL | 1 refills | Status: DC
Start: 2023-07-03 — End: 2023-11-30

## 2023-07-03 NOTE — Patient Instructions (Addendum)
 Return in about 24 weeks (around 12/18/2023) for Routine chronic condition follow-up.        Great to see you today.  I have refilled the medication(s) we provide.   If labs were collected or images ordered, we will inform you of  results once we have received them and reviewed. We will contact you either by echart message, or telephone call.  Please give ample time to the testing facility, and our office to run,  receive and review results. Please do not call inquiring of results, even if you can see them in your chart. We will contact you as soon as we are able. If it has been over 1 week since the test was completed, and you have not yet heard from us , then please call us .    - echart message- for normal results that have been seen by the patient already.   - telephone call: abnormal results or if patient has not viewed results in their echart.  If a referral to a specialist was entered for you, please call us  in 2 weeks if you have not heard from the specialist office to schedule.

## 2023-07-03 NOTE — Progress Notes (Signed)
 Patient ID: Carla Beltran, female  DOB: 09-11-1965, 58 y.o.   MRN: [id number] Patient Care Team    Relationship Specialty Notifications Start End  Carla Shope, DO PCP - General Family Medicine  05/28/16   Carla Hugger, MD Consulting Physician Gastroenterology  03/13/17     Chief Complaint  Patient presents with   Hyperlipidemia    Chronic Conditions/illness Management     Subjective: Carla Beltran is a 58 y.o.  Female  present for condition management combined appointment.  All past medical history/family history was updated today appropriate   Hyperlipidemia/overweight/declined statin: She has had elevated LDL in the past, last labs 2020-ldl 187.  She had declined all medications to treat cholesterol (LDL 242).  She had been counseled on the benefits of statins.  She has a family history of stroke.   Last CPE in January patient again has elevated cholesterol and is agreeable to starting Zetia  10 mg in the evening.  She is fasting today.  Today she reports she started med and tolerating.   Anxiety/phobia:  Pt reports compliance with lexapro  20 mg qd.  She is continuing to see her Librarian, academic. Both her daughters have had their babies and she loves being a grandma to 3.  She feels medication is working well for her. Original note on establishment: Patient presents for establishment today and admits to increased anxiety and panic attacks. She has fears that she has trouble controlling. She worries about everything. She has a strong fear of [place] the [place], especially in areas she is unfamiliar with. She endorses panic attacks that occur during driving and at other times. She states she experiences a increased heart rate and then feeling like she is "out of her body ". She reports increased anxiety in her life especially around times when she is moving, such as currently. She states she's always had anxiety. She reports she did not have a great childhood. Her  mother now has dementia and is in [place]  in a nursing facility, this also caused her increased anxiety. Her mother with hoarder. She has 2 daughters, which also are affected by anxiety and are on Celexa. She states her daughters are not affected by his much panic she is. She reports being on Zoloft since 2006. She was initially on 25 mg of Zoloft daily, and eventually increased up to Zoloft 50 mg daily. Prior to moving in August 2017 her prior provider told her to increase to 75 mg daily. She states that she did not like the apathetic side effects she experienced at 75 mg dose, so she decreased back down to the 50 mg dose daily. She reports being seen by counselor many times in the past. She has seek the help of a Librarian, academic in [place]). She has yet to establish with her.  Recurrent UTI; Reports she is still taking macrobid  post coital, medication has been effective on preventing UTI.       03/11/2023    8:01 AM 08/15/2022    9:54 AM 12/13/2021    8:51 AM 06/12/2021    8:56 AM 07/24/2020    9:28 AM  Depression screen PHQ 2/9  Decreased Interest 0 0 0 0 1  Down, Depressed, Hopeless 0 0 0 0 1  PHQ - 2 Score 0 0 0 0 2  Altered sleeping 0 0 0 0 1  Tired, decreased energy 0 1 0 0 1  Change in appetite 0 0  0 0 1  Feeling bad or failure about yourself  0 0 0 0 0  Trouble concentrating 0 0 0 0 0  Moving slowly or fidgety/restless 0 0 0 0 0  Suicidal thoughts 0 0 0 0 0  PHQ-9 Score 0 1 0 0 5  Difficult doing work/chores Not difficult at all Not difficult at all         03/11/2023    8:01 AM 08/15/2022    9:54 AM 12/13/2021    8:52 AM 06/12/2021    8:56 AM  GAD 7 : Generalized Anxiety Score  Nervous, Anxious, on Edge 0 0 0 0  Control/stop worrying 0 0 0 0  Worry too much - different things 0 0 0 0  Trouble relaxing 0 0 0 0  Restless 0 0 0 0  Easily annoyed or irritable 0 0 0 0  Afraid - awful might happen 0 0 0 0  Total GAD 7 Score 0 0 0 0  Anxiety  Difficulty Not difficult at all Not difficult at all      Immunization History  Administered Date(s) Administered   Influenza,inj,Quad PF,6+ Mos 12/16/2021   PFIZER(Purple Top)SARS-COV-2 Vaccination 06/09/2019, 07/16/2019   Tdap 10/26/2012, 03/11/2023   Zoster Recombinant(Shingrix ) 09/16/2017, 11/26/2017     Past Medical History:  Diagnosis Date   Allergy    Anemia    Anxiety    Depression    Enlarged lymph nodes 06/12/2020   Fatigue    Frequent UTI    Hyperlipidemia    Migraine    Reactive airway disease    patient unaware RAD   Renal stones    Rosacea    Vertigo    was on antivert   Allergies  Allergen Reactions   Codeine    Past Surgical History:  Procedure Laterality Date   BREAST BIOPSY  1990   benign   BREAST EXCISIONAL BIOPSY Right 30+ yrs ago   benign   LASIK  2001   TONSILLECTOMY AND ADENOIDECTOMY  1985   WISDOM TOOTH EXTRACTION     Family History  Problem Relation Age of Onset   Diabetes Mother    Mental illness Mother        Hoarder   Dementia Mother    Asthma Mother    Depression Mother    Migraines Mother    Colon polyps Father    Atrial fibrillation Father    Hypertension Father    Diabetes Maternal Aunt    Hearing loss Maternal Aunt    Stroke Maternal Grandmother    Arthritis Maternal Grandfather    Heart disease Maternal Grandfather    Colon polyps Maternal Grandfather    Colon polyps Paternal Grandfather    Colon cancer Neg Hx    Esophageal cancer Neg Hx    Rectal cancer Neg Hx    Stomach cancer Neg Hx    Social History   Social History Narrative   Married to Carla Beltran. Two adult children Carla Beltran and Carla Beltran (in college).    Moved from [place]  [facility]  area) 09/2015.    SAHM. College educated.    Drinks one cup of coffee a day.   Takes a daily vitamin.   Wears seatbelts, bicycle helmet and smoke detector in the home.   Tries to exercise routinely.   Feels safe in her relationships.    Allergies as of 07/03/2023        Reactions   Codeine         Medication List  Accurate as of Jul 03, 2023 10:29 AM. If you have any questions, ask your nurse or doctor.          escitalopram  20 MG tablet Commonly known as: Lexapro  Take 1 tablet (20 mg total) by mouth daily.   ezetimibe  10 MG tablet Commonly known as: Zetia  Take 1 tablet (10 mg total) by mouth daily.   nitrofurantoin  (macrocrystal-monohydrate) 100 MG capsule Commonly known as: MACROBID  Take 1 capsule (100 mg total) by mouth as needed. 1 capsule after intercourse to prevent UTI   VITAMIN D  PO Take 10,000 Units by mouth daily.        All past medical history, surgical history, allergies, family history, immunizations andmedications were updated in the EMR today and reviewed under the history and medication portions of their EMR.     ROS: 14 pt review of systems performed and negative (unless mentioned in an HPI)  Objective: BP 118/72   Pulse 72   Temp 98 F (36.7 C)   Wt 159 lb (72.1 kg)   LMP 12/31/2014   SpO2 98%   BMI 26.46 kg/m  Physical Exam Vitals and nursing note reviewed.  Constitutional:      General: She is not in acute distress.    Appearance: Normal appearance. She is not ill-appearing, toxic-appearing or diaphoretic.  HENT:     Head: Normocephalic and atraumatic.  Eyes:     General: No scleral icterus.       Right eye: No discharge.        Left eye: No discharge.     Extraocular Movements: Extraocular movements intact.     Conjunctiva/sclera: Conjunctivae normal.     Pupils: Pupils are equal, round, and reactive to light.  Cardiovascular:     Rate and Rhythm: Normal rate and regular rhythm.  Pulmonary:     Effort: Pulmonary effort is normal. No respiratory distress.     Breath sounds: Normal breath sounds. No wheezing, rhonchi or rales.  Musculoskeletal:     Right lower leg: No edema.     Left lower leg: No edema.  Skin:    General: Skin is warm.     Findings: No rash.  Neurological:      Mental Status: She is alert and oriented to person, place, and time. Mental status is at baseline.     Motor: No weakness.     Gait: Gait normal.  Psychiatric:        Mood and Affect: Mood normal.        Behavior: Behavior normal.        Thought Content: Thought content normal.        Judgment: Judgment normal.      No results found.  Assessment/plan: Carla Beltran is a 58 y.o. female present for chronic condition management Generalized anxiety disorder/panic attacks/depression Stable Continue lexapro  to 20 mg qd.  - continue  Librarian, academic.  Recurrent UTI: Stable Continue macrobid  100 mg PRN post coital.  Declines refills today  Vitamin D  deficiency DEXA screening ordered- 02/2023 - Vitamin D  (25 hydroxy) 25-repeated today Supplement-forgets sometimes  Mixed hyperlipidemia Persistent elevated cholesterol, now started on Zetia -tolerating LFTs and lipids collected today-fasting Cardiac CT-discussed and ordered-[place]   Return in about 24 weeks (around 12/18/2023) for Routine chronic condition follow-up.  Meds ordered this encounter  Medications   escitalopram  (LEXAPRO ) 20 MG tablet    Sig: Take 1 tablet (20 mg total) by mouth daily.    Dispense:  90 tablet    Refill:  1   ezetimibe  (ZETIA ) 10 MG tablet    Sig: Take 1 tablet (10 mg total) by mouth daily.    Dispense:  90 tablet    Refill:  3    Referral Orders  No referral(s) requested today    Orders Placed This Encounter  Procedures   CT CARDIAC SCORING (SELF PAY ONLY)    Standing Status:   Future    Expiration Date:   07/02/2024    Is patient pregnant?:   No    Preferred imaging location?:   [facility]   Lipid panel   Hepatic function panel   Vitamin D  (25 hydroxy)    Electronically signed by: Napolean Backbone, DO [facility] [place]

## 2023-07-07 ENCOUNTER — Ambulatory Visit: Payer: Self-pay | Admitting: Family Medicine

## 2023-07-07 LAB — VITAMIN D 25 HYDROXY (VIT D DEFICIENCY, FRACTURES): VITD: 25.85 ng/mL — ABNORMAL LOW (ref 30.00–100.00)

## 2023-07-08 ENCOUNTER — Ambulatory Visit (INDEPENDENT_AMBULATORY_CARE_PROVIDER_SITE_OTHER): Payer: Self-pay

## 2023-07-08 DIAGNOSIS — Z8249 Family history of ischemic heart disease and other diseases of the circulatory system: Secondary | ICD-10-CM

## 2023-07-08 DIAGNOSIS — Z136 Encounter for screening for cardiovascular disorders: Secondary | ICD-10-CM | POA: Diagnosis not present

## 2023-07-08 DIAGNOSIS — E782 Mixed hyperlipidemia: Secondary | ICD-10-CM

## 2023-07-08 DIAGNOSIS — Z823 Family history of stroke: Secondary | ICD-10-CM

## 2023-07-08 DIAGNOSIS — I2510 Atherosclerotic heart disease of native coronary artery without angina pectoris: Secondary | ICD-10-CM | POA: Diagnosis not present

## 2023-08-07 ENCOUNTER — Encounter: Payer: Self-pay | Admitting: Family Medicine

## 2023-08-07 ENCOUNTER — Ambulatory Visit (INDEPENDENT_AMBULATORY_CARE_PROVIDER_SITE_OTHER): Admitting: Family Medicine

## 2023-08-07 VITALS — BP 106/70 | HR 77 | Temp 98.1°F | Wt 155.0 lb

## 2023-08-07 DIAGNOSIS — J329 Chronic sinusitis, unspecified: Secondary | ICD-10-CM

## 2023-08-07 DIAGNOSIS — B9689 Other specified bacterial agents as the cause of diseases classified elsewhere: Secondary | ICD-10-CM | POA: Diagnosis not present

## 2023-08-07 MED ORDER — PREDNISONE 20 MG PO TABS: 20.0000 mg | ORAL_TABLET | Freq: Every day | ORAL | 0 refills | Status: AC

## 2023-08-07 MED ORDER — AZITHROMYCIN 250 MG PO TABS: ORAL_TABLET | ORAL | 0 refills | Status: AC

## 2023-08-07 NOTE — Patient Instructions (Addendum)

## 2023-08-07 NOTE — Progress Notes (Signed)
 Carla Beltran , [birth year], 58 y.o., female MRN: [id number] Patient Care Team    Relationship Specialty Notifications Start End  Mariel Shope, DO PCP - General Family Medicine  05/28/16   Albertina Hugger, MD Consulting Physician Gastroenterology  03/13/17     Chief Complaint  Patient presents with   Cough    On an off since 6/7; nasal/head congestion, cough, scratchy throat. Pt has been drinking hot tea.      Subjective: Carla Beltran is a 58 y.o. Pt presents for an OV with complaints of nasal congestion intermittently over the last few weeks (started 6/7).   She endorses cough, scratchy throat, sneezing. Grandkids sick 6/7. One of them with pna.  Patient endorses scratchy throat 6/11 with cough and sinus symptoms that improved. Then 6/18 restarted with symptoms. Husband is treated for sinusitis. Pt has tried drinking hot tea to ease their symptoms.      03/11/2023    8:01 AM 08/15/2022    9:54 AM 12/13/2021    8:51 AM 06/12/2021    8:56 AM 07/24/2020    9:28 AM  Depression screen PHQ 2/9  Decreased Interest 0 0 0 0 1  Down, Depressed, Hopeless 0 0 0 0 1  PHQ - 2 Score 0 0 0 0 2  Altered sleeping 0 0 0 0 1  Tired, decreased energy 0 1 0 0 1  Change in appetite 0 0 0 0 1  Feeling bad or failure about yourself  0 0 0 0 0  Trouble concentrating 0 0 0 0 0  Moving slowly or fidgety/restless 0 0 0 0 0  Suicidal thoughts 0 0 0 0 0  PHQ-9 Score 0 1 0 0 5  Difficult doing work/chores Not difficult at all Not difficult at all       Allergies  Allergen Reactions   Codeine    Social History   Social History Narrative   Married to [place]. Two adult children [name] and [name] (in college).    Moved from [place]  [facility] [place]  area) 09/2015.    SAHM. College educated.    Drinks one cup of coffee a day.   Takes a daily vitamin.   Wears seatbelts, bicycle helmet and smoke detector in the home.   Tries to exercise routinely.   Feels safe in her relationships.   Past  Medical History:  Diagnosis Date   Allergy    Anemia    Anxiety    Depression    Enlarged lymph nodes 06/12/2020   Fatigue    Frequent UTI    Hyperlipidemia    Migraine    Reactive airway disease    patient unaware RAD   Renal stones    Rosacea    Vertigo    was on antivert   Past Surgical History:  Procedure Laterality Date   BREAST BIOPSY  1990   benign   BREAST EXCISIONAL BIOPSY Right 30+ yrs ago   benign   LASIK  2001   TONSILLECTOMY AND ADENOIDECTOMY  1985   WISDOM TOOTH EXTRACTION     Family History  Problem Relation Age of Onset   Diabetes Mother    Mental illness Mother        Hoarder   Dementia Mother    Asthma Mother    Depression Mother    Migraines Mother    Colon polyps Father    Atrial fibrillation Father    Hypertension Father  Diabetes Maternal Aunt    Hearing loss Maternal Aunt    Stroke Maternal Grandmother    Arthritis Maternal Grandfather    Heart disease Maternal Grandfather    Colon polyps Maternal Grandfather    Colon polyps Paternal Grandfather    Colon cancer Neg Hx    Esophageal cancer Neg Hx    Rectal cancer Neg Hx    Stomach cancer Neg Hx    Allergies as of 08/07/2023       Reactions   Codeine         Medication List        Accurate as of August 07, 2023  3:07 PM. If you have any questions, ask your nurse or doctor.          azithromycin 250 MG tablet Commonly known as: ZITHROMAX Take 2 tablets on day 1, then 1 tablet daily on days 2 through 5 Started by: Napolean Backbone   escitalopram  20 MG tablet Commonly known as: Lexapro  Take 1 tablet (20 mg total) by mouth daily.   ezetimibe  10 MG tablet Commonly known as: Zetia  Take 1 tablet (10 mg total) by mouth daily.   nitrofurantoin  (macrocrystal-monohydrate) 100 MG capsule Commonly known as: MACROBID  Take 1 capsule (100 mg total) by mouth as needed. 1 capsule after intercourse to prevent UTI   predniSONE 20 MG tablet Commonly known as: DELTASONE Take 1 tablet  (20 mg total) by mouth daily with breakfast for 5 days. Started by: Kaylib Furness   VITAMIN D  PO Take 10,000 Units by mouth daily.        All past medical history, surgical history, allergies, family history, immunizations andmedications were updated in the EMR today and reviewed under the history and medication portions of their EMR.     Review of Systems  Constitutional:  Positive for malaise/fatigue.  HENT:  Positive for congestion and sore throat.   Eyes: Negative.   Respiratory:  Positive for cough. Negative for hemoptysis, sputum production, shortness of breath and wheezing.   Cardiovascular: Negative.   Genitourinary: Negative.   Musculoskeletal: Negative.   Neurological:  Negative for dizziness.  Endo/Heme/Allergies: Negative.    Negative, with the exception of above mentioned in HPI   Objective:  BP 106/70   Pulse 77   Temp 98.1 F (36.7 C)   Wt 155 lb (70.3 kg)   LMP 12/31/2014   SpO2 98%   BMI 25.79 kg/m  Body mass index is 25.79 kg/m. Physical Exam Vitals and nursing note reviewed.  Constitutional:      General: She is not in acute distress.    Appearance: Normal appearance. She is normal weight. She is not ill-appearing or toxic-appearing.  HENT:     Head: Normocephalic and atraumatic.     Right Ear: Ear canal and external ear normal. There is no impacted cerumen.     Left Ear: Ear canal and external ear normal. There is no impacted cerumen.     Nose: Congestion and rhinorrhea present. Rhinorrhea is purulent.     Right Turbinates: Not swollen.     Left Turbinates: Swollen.     Right Sinus: No maxillary sinus tenderness or frontal sinus tenderness.     Left Sinus: No maxillary sinus tenderness or frontal sinus tenderness.     Comments:  Left sided intranasal erythema, mild swelling    Mouth/Throat:     Mouth: Mucous membranes are moist.     Pharynx: Postnasal drip present. No oropharyngeal exudate or posterior oropharyngeal erythema.  Eyes:      General: No scleral icterus.       Right eye: No discharge.        Left eye: No discharge.     Extraocular Movements: Extraocular movements intact.     Conjunctiva/sclera: Conjunctivae normal.     Pupils: Pupils are equal, round, and reactive to light.    Cardiovascular:     Rate and Rhythm: Normal rate and regular rhythm.     Heart sounds: No murmur heard. Pulmonary:     Effort: Pulmonary effort is normal. No respiratory distress.     Breath sounds: No wheezing, rhonchi or rales.     Comments: Cough present  Musculoskeletal:     Cervical back: Neck supple.   Skin:    Findings: No rash.   Neurological:     Mental Status: She is alert and oriented to person, place, and time. Mental status is at baseline.     Motor: No weakness.     Coordination: Coordination normal.     Gait: Gait normal.   Psychiatric:        Mood and Affect: Mood normal.        Behavior: Behavior normal.        Thought Content: Thought content normal.        Judgment: Judgment normal.     No results found. No results found. No results found for this or any previous visit (from the past 24 hours).  Assessment/Plan: Carla Beltran is a 58 y.o. female present for OV for  Bacterial sinusitis (Primary) It seems she and her family been passing around a viral infection, her smell lasting longer will likely have a secondary bacterial infection. Rest, hydrate.  Consider mucinex (DM if cough) OTC to help treat symptoms Encouraged to use of nasal saline flushes 1-2 times a day Azithromycin prescribed, take until completed.  Low-dose prednisone burst x 5 days if needed If cough present it can last up to 6-8 weeks.  F/U 2 weeks if not improved.    Reviewed expectations re: course of current medical issues. Discussed self-management of symptoms. Outlined signs and symptoms indicating need for more acute intervention. Patient verbalized understanding and all questions were answered. Patient received an  After-Visit Summary.    No orders of the defined types were placed in this encounter.  Meds ordered this encounter  Medications   azithromycin (ZITHROMAX) 250 MG tablet    Sig: Take 2 tablets on day 1, then 1 tablet daily on days 2 through 5    Dispense:  6 tablet    Refill:  0   predniSONE (DELTASONE) 20 MG tablet    Sig: Take 1 tablet (20 mg total) by mouth daily with breakfast for 5 days.    Dispense:  5 tablet    Refill:  0   Referral Orders  No referral(s) requested today     Note is dictated utilizing voice recognition software. Although note has been proof read prior to signing, occasional typographical errors still can be missed. If any questions arise, please do not hesitate to call for verification.   electronically signed by:  Napolean Backbone, DO  [facility] [place]

## 2023-08-10 ENCOUNTER — Ambulatory Visit: Payer: Self-pay | Admitting: *Deleted

## 2023-08-10 ENCOUNTER — Telehealth: Payer: Self-pay

## 2023-08-10 MED ORDER — AMOXICILLIN-POT CLAVULANATE 875-125 MG PO TABS
1.0000 | ORAL_TABLET | Freq: Two times a day (BID) | ORAL | 0 refills | Status: DC
Start: 2023-08-10 — End: 2023-09-24

## 2023-08-10 NOTE — Telephone Encounter (Signed)
 Communication  Reason for CRM: Patient called in wanted to know if she could take Advil for her headache, would like for a nurse to give her a callback   Pt inquiring to see if it is okay to take Advil along with the antibiotic/current meds for headache. If so, how much is she allowed to take. Please advise.

## 2023-08-10 NOTE — Telephone Encounter (Signed)
 FYI Only or Action Required?: Action required by provider: update on patient condition and requesting to change zithromax  to augmentin.  Patient was last seen in primary care on 08/07/2023 by Catherine Fuller A, DO. Called Nurse Triage reporting Medication Problem. Symptoms began several days ago. Interventions attempted: Prescription medications: zithromax . Symptoms are: gradually worsening.  Triage Disposition: See PCP When Office is Open (Within 3 Days)  Patient/caregiver understands and will follow disposition?: No, wishes to speak with PCP                Copied from CRM [id number]. Topic: Clinical - Red Word Triage >> Aug 10, 2023  9:03 AM Laymon HERO wrote: Red Word that prompted transfer to Nurse Triage: was seen on friday and started zithromax  -  not feeling better. her husband is in the hospital with double pneumonia- wanting to know if she can switch to Augmentin. She does not feel any better with the antibiotic Reason for Disposition  Prescription request for new medicine (not a refill)  Answer Assessment - Initial Assessment Questions 1. NAME of MEDICINE: What medicine(s) are you calling about?     Azithromax  2. QUESTION: What is your question? (e.g., double dose of medicine, side effect)     Can medication be changed to Augmentin? Can patient take advil for headache, and should patient get inhaler?  3. PRESCRIBER: Who prescribed the medicine? Reason: if prescribed by specialist, call should be referred to that group.     PCP 4. SYMPTOMS: Do you have any symptoms? If Yes, ask: What symptoms are you having?  How bad are the symptoms (e.g., mild, moderate, severe)     Headache, cough, nausea worsening at times. Chills no fever. Dry heaves' at times. Can tolerate toast and minimal food. Very anxious 5. PREGNANCY:  Is there any chance that you are pregnant? When was your last menstrual period?     Na  Patient requesting different antibiotic due to her husband  is in hospital treated for parainfluenza 3 and pneumonia. Husband's Dr. Recommended patient to take different medication due to possibility she has same dx as patient. Patient very anxious and requesting medication change, if she can take advil for headache, and if inhaler needed.  Protocols used: Medication Question Call-A-AH

## 2023-08-10 NOTE — Telephone Encounter (Signed)
 Yes is okay with Advil with antibiotics

## 2023-08-10 NOTE — Telephone Encounter (Signed)
 Pt made aware

## 2023-08-10 NOTE — Telephone Encounter (Signed)
 Printed Augmentin prescription.  Please fax to pharmacy and inform patient new prescription was called in.  Please do not forward nurse triage calls.  Must make no follow-up in order for any action by the provider to be placed

## 2023-08-10 NOTE — Telephone Encounter (Signed)
 Pt aware and verbalized understanding.

## 2023-08-11 ENCOUNTER — Ambulatory Visit (INDEPENDENT_AMBULATORY_CARE_PROVIDER_SITE_OTHER): Admitting: Family Medicine

## 2023-08-11 ENCOUNTER — Encounter: Payer: Self-pay | Admitting: Family Medicine

## 2023-08-11 ENCOUNTER — Ambulatory Visit (INDEPENDENT_AMBULATORY_CARE_PROVIDER_SITE_OTHER)

## 2023-08-11 VITALS — BP 122/80 | HR 73 | Temp 98.1°F | Wt 155.0 lb

## 2023-08-11 DIAGNOSIS — R053 Chronic cough: Secondary | ICD-10-CM | POA: Insufficient documentation

## 2023-08-11 DIAGNOSIS — R059 Cough, unspecified: Secondary | ICD-10-CM | POA: Diagnosis not present

## 2023-08-11 NOTE — Progress Notes (Signed)
 Carla Beltran , 10-21-1965, 58 y.o., female MRN: [id number] Patient Care Team    Relationship Specialty Notifications Start End  Catherine Charlies LABOR, DO PCP - General Family Medicine  05/28/16   Legrand Victory LITTIE DOUGLAS, MD Consulting Physician Gastroenterology  03/13/17     Chief Complaint  Patient presents with   Nasal Congestion    Continued nasal congestion, cough.      Subjective: Carla Beltran is a 58 y.o. Pt presents for an OV with continued complaints of nasal congestion intermittently over the last few weeks (started 6/7). Pt was seen 08/07/2023  prescribed azith (last dose tonight) and prednisone > she requested azith bc her husband had it. She did not want to take the prednisone  and wanted to wait, she did not end up starting the prednisone  She called back in yesterday requesting a new abx prescription bc she still had symptoms-Per the call center, patient reports this is not exactly how the conversation had occurred between the call center. Augmentin was prescribed out of courtesy.  Patient has not started Augmentin as of yet.  Pt is present today and upset an antibiotic was called in for her, because that was not all that was being asked and she feels the call center did not respond appropriately to her communication attempt with them. Her vitals have been stable. Afebrile.  Patient reports she did feel worse on Sunday, despite starting the azithromycin .  Her husband's condition also worsened and he had to be hospitalized for double viral parainfluenza 3 pneumonia with decreased oxygen saturations in the 80s.  He was placed on Augmentin. Patient denies any fevers, chills or shortness of breath.  She does endorse continued congestion.    Prior note: She endorses cough, scratchy throat, sneezing. Grandkids sick 6/7. One of them with pna.  Patient endorses scratchy throat 6/11 with cough and sinus symptoms that improved. Then 6/18 restarted with symptoms. Husband is treated for  sinusitis. Pt has tried drinking hot tea to ease their symptoms.      03/11/2023    8:01 AM 08/15/2022    9:54 AM 12/13/2021    8:51 AM 06/12/2021    8:56 AM 07/24/2020    9:28 AM  Depression screen PHQ 2/9  Decreased Interest 0 0 0 0 1  Down, Depressed, Hopeless 0 0 0 0 1  PHQ - 2 Score 0 0 0 0 2  Altered sleeping 0 0 0 0 1  Tired, decreased energy 0 1 0 0 1  Change in appetite 0 0 0 0 1  Feeling bad or failure about yourself  0 0 0 0 0  Trouble concentrating 0 0 0 0 0  Moving slowly or fidgety/restless 0 0 0 0 0  Suicidal thoughts 0 0 0 0 0  PHQ-9 Score 0 1 0 0 5  Difficult doing work/chores Not difficult at all Not difficult at all       Allergies  Allergen Reactions   Codeine    Social History   Social History Narrative   Married to [place]. Two adult children Estef[place] and [place] (in college).    Moved from [place]  [facility]: [place]  area) 09/2015.    SAHM. College educated.    Drinks one cup of coffee a day.   Takes a daily vitamin.   Wears seatbelts, bicycle helmet and smoke detector in the home.   Tries to exercise routinely.   Feels safe in her relationships.   Past Medical History:  Diagnosis  Date   Allergy    Anemia    Anxiety    Depression    Enlarged lymph nodes 06/12/2020   Fatigue    Frequent UTI    Hyperlipidemia    Migraine    Reactive airway disease    patient unaware RAD   Renal stones    Rosacea    Vertigo    was on antivert   Past Surgical History:  Procedure Laterality Date   BREAST BIOPSY  1990   benign   BREAST EXCISIONAL BIOPSY Right 30+ yrs ago   benign   LASIK  2001   TONSILLECTOMY AND ADENOIDECTOMY  1985   WISDOM TOOTH EXTRACTION     Family History  Problem Relation Age of Onset   Diabetes Mother    Mental illness Mother        Hoarder   Dementia Mother    Asthma Mother    Depression Mother    Migraines Mother    Colon polyps Father    Atrial fibrillation Father    Hypertension Father    Diabetes Maternal Aunt    Hearing loss  Maternal Aunt    Stroke Maternal Grandmother    Arthritis Maternal Grandfather    Heart disease Maternal Grandfather    Colon polyps Maternal Grandfather    Colon polyps Paternal Grandfather    Colon cancer Neg Hx    Esophageal cancer Neg Hx    Rectal cancer Neg Hx    Stomach cancer Neg Hx    Allergies as of 08/11/2023       Reactions   Codeine         Medication List        Accurate as of August 11, 2023  3:17 PM. If you have any questions, ask your nurse or doctor.          amoxicillin-clavulanate 875-125 MG tablet Commonly known as: AUGMENTIN Take 1 tablet by mouth 2 (two) times daily.   azithromycin  250 MG tablet Commonly known as: ZITHROMAX  Take 2 tablets on day 1, then 1 tablet daily on days 2 through 5   escitalopram  20 MG tablet Commonly known as: Lexapro  Take 1 tablet (20 mg total) by mouth daily.   ezetimibe  10 MG tablet Commonly known as: Zetia  Take 1 tablet (10 mg total) by mouth daily.   nitrofurantoin  (macrocrystal-monohydrate) 100 MG capsule Commonly known as: MACROBID  Take 1 capsule (100 mg total) by mouth as needed. 1 capsule after intercourse to prevent UTI   predniSONE  20 MG tablet Commonly known as: DELTASONE  Take 1 tablet (20 mg total) by mouth daily with breakfast for 5 days.   VITAMIN D  PO Take 10,000 Units by mouth daily.        All past medical history, surgical history, allergies, family history, immunizations andmedications were updated in the EMR today and reviewed under the history and medication portions of their EMR.     Review of Systems  Constitutional:  Positive for malaise/fatigue.  HENT:  Positive for congestion and sore throat.   Eyes: Negative.   Respiratory:  Positive for cough. Negative for hemoptysis, sputum production, shortness of breath and wheezing.   Cardiovascular: Negative.   Genitourinary: Negative.   Musculoskeletal: Negative.   Neurological:  Negative for dizziness.  Endo/Heme/Allergies: Negative.     Negative, with the exception of above mentioned in HPI   Objective:  BP 122/80   Pulse 73   Temp 98.1 F (36.7 C)   Wt 155 lb (70.3 kg)  LMP 12/31/2014   SpO2 97%   BMI 25.79 kg/m  Body mass index is 25.79 kg/m. Physical Exam Vitals and nursing note reviewed.  Constitutional:      General: She is not in acute distress.    Appearance: Normal appearance. She is normal weight. She is not ill-appearing or toxic-appearing.  HENT:     Head: Normocephalic and atraumatic.     Right Ear: Ear canal and external ear normal. There is no impacted cerumen.     Left Ear: Ear canal and external ear normal. There is no impacted cerumen.     Nose: Congestion and rhinorrhea present. Rhinorrhea is purulent.     Right Turbinates: Not swollen.     Left Turbinates: Swollen.     Right Sinus: No maxillary sinus tenderness or frontal sinus tenderness.     Left Sinus: No maxillary sinus tenderness or frontal sinus tenderness.     Comments:  Left sided intranasal erythema, mild swelling    Mouth/Throat:     Mouth: Mucous membranes are moist.     Pharynx: Postnasal drip present. No oropharyngeal exudate or posterior oropharyngeal erythema.   Eyes:     General: No scleral icterus.       Right eye: No discharge.        Left eye: No discharge.     Extraocular Movements: Extraocular movements intact.     Conjunctiva/sclera: Conjunctivae normal.     Pupils: Pupils are equal, round, and reactive to light.    Cardiovascular:     Rate and Rhythm: Normal rate and regular rhythm.     Heart sounds: No murmur heard. Pulmonary:     Effort: Pulmonary effort is normal. No respiratory distress.     Breath sounds: Rhonchi present. No wheezing or rales.     Comments: Cough present  Musculoskeletal:     Cervical back: Neck supple. No tenderness.  Lymphadenopathy:     Cervical: No cervical adenopathy.   Skin:    Findings: No rash.   Neurological:     Mental Status: She is alert and oriented to  person, place, and time. Mental status is at baseline.     Motor: No weakness.     Coordination: Coordination normal.     Gait: Gait normal.   Psychiatric:        Mood and Affect: Mood normal.        Behavior: Behavior normal.        Thought Content: Thought content normal.        Judgment: Judgment normal.     No results found. No results found. No results found for this or any previous visit (from the past 24 hours).  Assessment/Plan: Carla Beltran is a 58 y.o. female present for OV for  Bacterial sinusitis (Primary) still present Mild rhonchi on exam, otherwise lung exam is normal.  Oxygen saturations are normal.  Her vital signs are stable. It appears her family's passing around a parainfluenza 3 infection, now affecting at least 7 family members. Two of those family members have now been hospitalized with pneumonia. Encouraged her to continue to rest and hydrate.  Reassured her her vitals are very stable at this time. Will obtain chest x-ray stat to rule out pneumonia, to be safe. Finish azithromycin  this evening. Consider mucinex (DM if cough) OTC to help treat symptoms Encouraged to use of nasal saline flushes 1-2 times a day Patient has Augmentin and low-dose prednisone  burst prescriptions already in hand.  If her chest x-ray  results with pneumonia or her symptoms do not continue to improve over the next couple days, or worsen she is to start the additional Augmentin antibiotic and prednisone  Low-dose prednisone  burst x 5 days if needed If cough present it can last up to 6-8 weeks.  F/U 2 weeks if not improved.    Reviewed expectations re: course of current medical issues. Discussed self-management of symptoms. Outlined signs and symptoms indicating need for more acute intervention. Patient verbalized understanding and all questions were answered. Patient received an After-Visit Summary.    Orders Placed This Encounter  Procedures   DG Chest 2 View   No orders of  the defined types were placed in this encounter.  Referral Orders  No referral(s) requested today     Note is dictated utilizing voice recognition software. Although note has been proof read prior to signing, occasional typographical errors still can be missed. If any questions arise, please do not hesitate to call for verification.   electronically signed by:  Charlies Bellini, DO  [facility] [place]

## 2023-08-12 ENCOUNTER — Ambulatory Visit: Payer: Self-pay | Admitting: Family Medicine

## 2023-08-24 ENCOUNTER — Ambulatory Visit: Payer: BC Managed Care – PPO | Admitting: Family Medicine

## 2023-09-07 DIAGNOSIS — H31092 Other chorioretinal scars, left eye: Secondary | ICD-10-CM | POA: Diagnosis not present

## 2023-09-07 DIAGNOSIS — H43813 Vitreous degeneration, bilateral: Secondary | ICD-10-CM | POA: Diagnosis not present

## 2023-09-07 DIAGNOSIS — H25042 Posterior subcapsular polar age-related cataract, left eye: Secondary | ICD-10-CM | POA: Diagnosis not present

## 2023-09-07 DIAGNOSIS — H2513 Age-related nuclear cataract, bilateral: Secondary | ICD-10-CM | POA: Diagnosis not present

## 2023-09-22 DIAGNOSIS — M25562 Pain in left knee: Secondary | ICD-10-CM | POA: Diagnosis not present

## 2023-09-22 DIAGNOSIS — M2352 Chronic instability of knee, left knee: Secondary | ICD-10-CM | POA: Diagnosis not present

## 2023-09-22 DIAGNOSIS — G8929 Other chronic pain: Secondary | ICD-10-CM | POA: Diagnosis not present

## 2023-09-24 ENCOUNTER — Encounter: Payer: Self-pay | Admitting: Family Medicine

## 2023-09-24 ENCOUNTER — Ambulatory Visit: Admitting: Family Medicine

## 2023-09-24 VITALS — BP 120/80 | HR 67 | Temp 98.2°F | Wt 155.6 lb

## 2023-09-24 DIAGNOSIS — E559 Vitamin D deficiency, unspecified: Secondary | ICD-10-CM | POA: Diagnosis not present

## 2023-09-24 DIAGNOSIS — E782 Mixed hyperlipidemia: Secondary | ICD-10-CM

## 2023-09-24 DIAGNOSIS — R931 Abnormal findings on diagnostic imaging of heart and coronary circulation: Secondary | ICD-10-CM

## 2023-09-24 DIAGNOSIS — R002 Palpitations: Secondary | ICD-10-CM

## 2023-09-24 DIAGNOSIS — Z823 Family history of stroke: Secondary | ICD-10-CM | POA: Diagnosis not present

## 2023-09-24 LAB — VITAMIN D 25 HYDROXY (VIT D DEFICIENCY, FRACTURES): VITD: 33.61 ng/mL (ref 30.00–100.00)

## 2023-09-24 NOTE — Progress Notes (Unsigned)
 Patient ID: Carla Beltran, female  DOB: 01/17/1966, 58 y.o.   MRN: [id number] Patient Care Team    Relationship Specialty Notifications Start End  Catherine Charlies LABOR, DO PCP - General Family Medicine  05/28/16   Legrand Victory LITTIE DOUGLAS, MD Consulting Physician Gastroenterology  03/13/17     Chief Complaint  Patient presents with   Hyperlipidemia    Started Zetia  in Feb.; Since March pt has been experiencing sleep disturbance which has been causing increased anxiety. Wants to discuss getting off of med.  Pt is not fasting.     Subjective: Carla Beltran is a 58 y.o.  Female  present for condition management combined appointment.  All past medical history/family history was updated today appropriate   Hyperlipidemia/overweight/declined statin: Patient has a history of elevated LDL in the past, -ldl trend 242, 187.  Initially she had declined all medications to treat cholesterol .  She had been counseled on the benefits of statins.  She has a family history of stroke.   January 2025 patient again has elevated cholesterol and was agreeable to starting Zetia  10 mg in the evening.  She tolerated Zetia  and repeat labs showed significant improvement in her LDL levels. Today she reports she would like to come off the Zetia  medication.  She feels it is causing her palpitations, causing her sleep disturbance and increased anxiety. She would like to have a referral to cardiology to discuss further.    Latest Reference Range & Units 07/03/23 07:47  Total CHOL/HDL Ratio  4  Cholesterol 0 - 200 mg/dL 776 (H)  HDL Cholesterol >39.00 mg/dL 40.89  LDL (calc) 0 - 99 mg/dL 849 (H)  NonHDL  835.65  Triglycerides 0.0 - 149.0 mg/dL 28.9  VLDL 0.0 - 59.9 mg/dL 85.7  VITD 69.99 - 899.99 ng/mL 25.85 (L)    Vitamin D  deficiency: Vitamin D  levels have been deficient with level of approximately 25.  She is supplementing vitamin D  5000u daily since June 20th.  Due for repeat lab today  -------------------  documentation only---------------------------- Anxiety/phobia:  Pt reports compliance with lexapro  20 mg qd.  She is continuing to see her Librarian, academic. Both her daughters have had their babies and she loves being a grandma to 3.  She feels medication is working well for her. Original note on establishment: Patient presents for establishment today and admits to increased anxiety and panic attacks. She has fears that she has trouble controlling. She worries about everything. She has a strong fear of [place], especially in areas she is unfamiliar with. She endorses panic attacks that occur during driving and at other times. She states she experiences a increased heart rate and then feeling like she is out of her body . She reports increased anxiety in her life especially around times when she is moving, such as currently. She states she's always had anxiety. She reports she did not have a great childhood. Her mother now has dementia and is in [place]  in a nursing facility, this also caused her increased anxiety. Her mother with hoarder. She has 2 daughters, which also are affected by anxiety and are on Celexa. She states her daughters are not affected by his much panic she is. She reports being on Zoloft since 2006. She was initially on 25 mg of Zoloft daily, and eventually increased up to Zoloft 50 mg daily. Prior to moving in August 2017 her prior provider told her to increase to 75 mg daily. She  states that she did not like the apathetic side effects she experienced at 75 mg dose, so she decreased back down to the 50 mg dose daily. She reports being seen by counselor many times in the past. She has seek the help of a Librarian, academic in [place] Delsie Graves). She has yet to establish with her.  Recurrent UTI; Reports she is still taking macrobid  post coital, medication has been effective on preventing UTI.   Cardiac CT general over read 07/08/2023. Lungs/Pleura:  There is no pleural effusion. A 2 mm pulmonary nodule is seen within the posterolateral aspect of the left lower lobe (axial CT image 38, CT series 4).   Upper abdomen: No significant findings in the visualized upper abdomen.   Musculoskeletal/Chest wall: No chest wall mass or suspicious osseous findings within the visualized chest.   IMPRESSION: 2 mm left lower lobe pulmonary nodule. No follow-up needed if patient is low-risk.This recommendation follows the consensus statement: Guidelines for Management of Incidental Pulmonary Nodules Detected on CT Images: From the Fleischner Society 2017; Radiology 2017; 284:228-243.        03/11/2023    8:01 AM 08/15/2022    9:54 AM 12/13/2021    8:51 AM 06/12/2021    8:56 AM 07/24/2020    9:28 AM  Depression screen PHQ 2/9  Decreased Interest 0 0 0 0 1  Down, Depressed, Hopeless 0 0 0 0 1  PHQ - 2 Score 0 0 0 0 2  Altered sleeping 0 0 0 0 1  Tired, decreased energy 0 1 0 0 1  Change in appetite 0 0 0 0 1  Feeling bad or failure about yourself  0 0 0 0 0  Trouble concentrating 0 0 0 0 0  Moving slowly or fidgety/restless 0 0 0 0 0  Suicidal thoughts 0 0 0 0 0  PHQ-9 Score 0 1 0 0 5  Difficult doing work/chores Not difficult at all Not difficult at all         03/11/2023    8:01 AM 08/15/2022    9:54 AM 12/13/2021    8:52 AM 06/12/2021    8:56 AM  GAD 7 : Generalized Anxiety Score  Nervous, Anxious, on Edge 0 0 0 0  Control/stop worrying 0 0 0 0  Worry too much - different things 0 0 0 0  Trouble relaxing 0 0 0 0  Restless 0 0 0 0  Easily annoyed or irritable 0 0 0 0  Afraid - awful might happen 0 0 0 0  Total GAD 7 Score 0 0 0 0  Anxiety Difficulty Not difficult at all Not difficult at all      Immunization History  Administered Date(s) Administered   Influenza,inj,Quad PF,6+ Mos 12/16/2021   PFIZER(Purple Top)SARS-COV-2 Vaccination 06/09/2019, 07/16/2019   Tdap 10/26/2012, 03/11/2023   Zoster Recombinant(Shingrix )  09/16/2017, 11/26/2017     Past Medical History:  Diagnosis Date   Allergy    Anemia    Anxiety    Depression    Enlarged lymph nodes 06/12/2020   Fatigue    Frequent UTI    Hyperlipidemia    Migraine    Reactive airway disease    patient unaware RAD   Renal stones    Rosacea    Vertigo    was on antivert   Allergies  Allergen Reactions   Codeine    Past Surgical History:  Procedure Laterality Date   BREAST BIOPSY  1990   benign   BREAST  EXCISIONAL BIOPSY Right 30+ yrs ago   benign   LASIK  2001   TONSILLECTOMY AND ADENOIDECTOMY  1985   WISDOM TOOTH EXTRACTION     Family History  Problem Relation Age of Onset   Diabetes Mother    Mental illness Mother        Hoarder   Dementia Mother    Asthma Mother    Depression Mother    Migraines Mother    Colon polyps Father    Atrial fibrillation Father    Hypertension Father    Diabetes Maternal Aunt    Hearing loss Maternal Aunt    Stroke Maternal Grandmother    Arthritis Maternal Grandfather    Heart disease Maternal Grandfather    Colon polyps Maternal Grandfather    Colon polyps Paternal Grandfather    Colon cancer Neg Hx    Esophageal cancer Neg Hx    Rectal cancer Neg Hx    Stomach cancer Neg Hx    Social History   Social History Narrative   Married to [place]. Two adult children Estefana and [place] (in college).    Moved from [place]  [facility]  area) 09/2015.    SAHM. College educated.    Drinks one cup of coffee a day.   Takes a daily vitamin.   Wears seatbelts, bicycle helmet and smoke detector in the home.   Tries to exercise routinely.   Feels safe in her relationships.    Allergies as of 09/24/2023       Reactions   Codeine         Medication List        Accurate as of September 24, 2023 11:59 PM. If you have any questions, ask your nurse or doctor.          STOP taking these medications    amoxicillin -clavulanate 875-125 MG tablet Commonly known as: AUGMENTIN  Stopped by: Charlies Bellini       TAKE these medications    escitalopram  20 MG tablet Commonly known as: Lexapro  Take 1 tablet (20 mg total) by mouth daily.   ezetimibe  10 MG tablet Commonly known as: Zetia  Take 1 tablet (10 mg total) by mouth daily.   nitrofurantoin  (macrocrystal-monohydrate) 100 MG capsule Commonly known as: MACROBID  Take 1 capsule (100 mg total) by mouth as needed. 1 capsule after intercourse to prevent UTI   VITAMIN D  PO Take 10,000 Units by mouth daily.        All past medical history, surgical history, allergies, family history, immunizations andmedications were updated in the EMR today and reviewed under the history and medication portions of their EMR.     ROS: 14 pt review of systems performed and negative (unless mentioned in an HPI)  Objective: BP 120/80   Pulse 67   Temp 98.2 F (36.8 C)   Wt 155 lb 9.6 oz (70.6 kg)   LMP 12/31/2014   SpO2 98%   BMI 25.89 kg/m  Physical Exam Vitals and nursing note reviewed.  Constitutional:      General: She is not in acute distress.    Appearance: Normal appearance. She is not ill-appearing, toxic-appearing or diaphoretic.  HENT:     Head: Normocephalic and atraumatic.  Eyes:     General: No scleral icterus.       Right eye: No discharge.        Left eye: No discharge.     Extraocular Movements: Extraocular movements intact.     Conjunctiva/sclera: Conjunctivae normal.  Pupils: Pupils are equal, round, and reactive to light.  Cardiovascular:     Rate and Rhythm: Normal rate and regular rhythm.  Pulmonary:     Effort: Pulmonary effort is normal. No respiratory distress.     Breath sounds: Normal breath sounds. No wheezing, rhonchi or rales.  Musculoskeletal:     Right lower leg: No edema.     Left lower leg: No edema.  Skin:    General: Skin is warm.     Findings: No rash.  Neurological:     Mental Status: She is alert and oriented to person, place, and time. Mental status is at baseline.     Motor: No  weakness.     Gait: Gait normal.  Psychiatric:        Mood and Affect: Mood normal.        Behavior: Behavior normal.        Thought Content: Thought content normal.        Judgment: Judgment normal.      No results found.  Assessment/plan: Carla Beltran is a 58 y.o. female present for chronic condition management Generalized anxiety disorder/panic attacks/depression Stable Continue lexapro  to 20 mg qd.  - continue  Librarian, academic.  Recurrent UTI: Stable Continue macrobid  100 mg PRN post coital.  Declines refills today  Vitamin D  deficiency DEXA screening ordered- 02/2023 - Vitamin D  (25 hydroxy) 25-repeated today Supplement-5000 units daily  Mixed hyperlipidemia Persistent elevated cholesterol with family history of stroke.  Adamantly declines statin use. Tolerated Zetia  10 mg daily but she wants to discontinue.  Encouraged her to go ahead and discontinue if that is what she desires.   Referral to cardiology placed for her to discuss risk and benefits in more detail   Cardiac CT-completed 07/08/2023 Coronary calcium score 10.9, 76 percentile Referral placed cardiology   Follow-up for normal routine schedule  No orders of the defined types were placed in this encounter.   Referral Orders  No referral(s) requested today    Orders Placed This Encounter  Procedures   Vitamin D  (25 hydroxy)    Electronically signed by: Charlies Bellini, DO [facility]

## 2023-09-24 NOTE — Patient Instructions (Signed)

## 2023-09-25 ENCOUNTER — Ambulatory Visit: Payer: Self-pay | Admitting: Family Medicine

## 2023-09-28 ENCOUNTER — Ambulatory Visit: Admitting: Family Medicine

## 2023-09-28 DIAGNOSIS — H3540 Unspecified peripheral retinal degeneration: Secondary | ICD-10-CM | POA: Diagnosis not present

## 2023-09-28 DIAGNOSIS — H25043 Posterior subcapsular polar age-related cataract, bilateral: Secondary | ICD-10-CM | POA: Diagnosis not present

## 2023-09-28 DIAGNOSIS — H2513 Age-related nuclear cataract, bilateral: Secondary | ICD-10-CM | POA: Diagnosis not present

## 2023-09-28 DIAGNOSIS — H2512 Age-related nuclear cataract, left eye: Secondary | ICD-10-CM | POA: Diagnosis not present

## 2023-09-29 ENCOUNTER — Ambulatory Visit: Admitting: Family Medicine

## 2023-10-15 ENCOUNTER — Telehealth: Payer: Self-pay

## 2023-10-15 ENCOUNTER — Encounter (INDEPENDENT_AMBULATORY_CARE_PROVIDER_SITE_OTHER): Admitting: Ophthalmology

## 2023-10-15 DIAGNOSIS — H35433 Paving stone degeneration of retina, bilateral: Secondary | ICD-10-CM

## 2023-10-15 DIAGNOSIS — H2513 Age-related nuclear cataract, bilateral: Secondary | ICD-10-CM

## 2023-10-15 DIAGNOSIS — H33302 Unspecified retinal break, left eye: Secondary | ICD-10-CM

## 2023-10-15 DIAGNOSIS — H35342 Macular cyst, hole, or pseudohole, left eye: Secondary | ICD-10-CM

## 2023-10-15 DIAGNOSIS — H43813 Vitreous degeneration, bilateral: Secondary | ICD-10-CM

## 2023-10-15 NOTE — Telephone Encounter (Signed)
 Copied from CRM #[id number]. Topic: Clinical - Medical Advice >> Oct 14, 2023  3:29 PM Thersia BROCKS wrote: Reason for CRM: Patient called in regarding her family history stated she saw stroke, and patient stated it was her stepmother that had a stroke so she isnt sure why that would be on family history , patient is filling out a paperwork for another dr and would like to know  [phone]  Would like clarification from [facility]

## 2023-10-15 NOTE — Telephone Encounter (Signed)
 Please advise if changes should be made

## 2023-10-15 NOTE — Telephone Encounter (Signed)
 When reviewing her records it appears we have her grandmother having a history of stroke.  If that is accurate, that is why she has on her old record a family history of stroke.

## 2023-10-15 NOTE — Telephone Encounter (Signed)
 No further action needed.

## 2023-10-26 ENCOUNTER — Encounter (INDEPENDENT_AMBULATORY_CARE_PROVIDER_SITE_OTHER): Admitting: Ophthalmology

## 2023-10-26 DIAGNOSIS — H33312 Horseshoe tear of retina without detachment, left eye: Secondary | ICD-10-CM

## 2023-11-30 ENCOUNTER — Other Ambulatory Visit: Payer: Self-pay | Admitting: Family Medicine

## 2023-12-18 ENCOUNTER — Ambulatory Visit: Admitting: Family Medicine

## 2023-12-30 ENCOUNTER — Ambulatory Visit
Admission: RE | Admit: 2023-12-30 | Discharge: 2023-12-30 | Disposition: A | Discharge status: Home / Self Care | Source: Ambulatory Visit | Attending: Family Medicine | Admitting: Family Medicine

## 2023-12-30 DIAGNOSIS — Z1231 Encounter for screening mammogram for malignant neoplasm of breast: Secondary | ICD-10-CM | POA: Diagnosis not present

## 2024-01-04 ENCOUNTER — Ambulatory Visit: Payer: Self-pay | Admitting: Family Medicine

## 2024-01-07 DIAGNOSIS — Z780 Asymptomatic menopausal state: Secondary | ICD-10-CM | POA: Diagnosis not present

## 2024-01-18 ENCOUNTER — Encounter (INDEPENDENT_AMBULATORY_CARE_PROVIDER_SITE_OTHER): Admitting: Ophthalmology

## 2024-01-18 ENCOUNTER — Encounter: Payer: Self-pay | Admitting: Family Medicine

## 2024-01-18 ENCOUNTER — Ambulatory Visit (INDEPENDENT_AMBULATORY_CARE_PROVIDER_SITE_OTHER): Admitting: Family Medicine

## 2024-01-18 ENCOUNTER — Other Ambulatory Visit (HOSPITAL_COMMUNITY)
Admission: RE | Admit: 2024-01-18 | Discharge: 2024-01-18 | Disposition: A | Source: Ambulatory Visit | Attending: Family Medicine | Admitting: Family Medicine

## 2024-01-18 VITALS — BP 122/80 | HR 73 | Temp 98.3°F | Wt 158.0 lb

## 2024-01-18 DIAGNOSIS — H43813 Vitreous degeneration, bilateral: Secondary | ICD-10-CM | POA: Diagnosis not present

## 2024-01-18 DIAGNOSIS — R8290 Unspecified abnormal findings in urine: Secondary | ICD-10-CM

## 2024-01-18 DIAGNOSIS — H2513 Age-related nuclear cataract, bilateral: Secondary | ICD-10-CM | POA: Diagnosis not present

## 2024-01-18 DIAGNOSIS — N898 Other specified noninflammatory disorders of vagina: Secondary | ICD-10-CM

## 2024-01-18 DIAGNOSIS — H35342 Macular cyst, hole, or pseudohole, left eye: Secondary | ICD-10-CM | POA: Diagnosis not present

## 2024-01-18 DIAGNOSIS — N390 Urinary tract infection, site not specified: Secondary | ICD-10-CM | POA: Diagnosis not present

## 2024-01-18 DIAGNOSIS — H33302 Unspecified retinal break, left eye: Secondary | ICD-10-CM

## 2024-01-18 LAB — POC URINALSYSI DIPSTICK (AUTOMATED)
Bilirubin, UA: NEGATIVE
Glucose, UA: NEGATIVE
Ketones, UA: NEGATIVE
Leukocytes, UA: NEGATIVE
Nitrite, UA: NEGATIVE
Protein, UA: NEGATIVE
Spec Grav, UA: 1.015 (ref 1.010–1.025)
Urobilinogen, UA: NEGATIVE U/dL — AB
pH, UA: 6 (ref 5.0–8.0)

## 2024-01-18 MED ORDER — NITROFURANTOIN MONOHYD MACRO 100 MG PO CAPS: 100.0000 mg | ORAL_CAPSULE | ORAL | 1 refills | Status: DC | PRN

## 2024-01-18 MED ORDER — NITROFURANTOIN MONOHYD MACRO 100 MG PO CAPS: 100.0000 mg | ORAL_CAPSULE | ORAL | 1 refills | Status: AC | PRN

## 2024-01-18 NOTE — Progress Notes (Signed)
 Carla Beltran , Oct 21, 1965, 58 y.o., female MRN: [id number] Patient Care Team    Relationship Specialty Notifications Start End  Catherine Charlies LABOR, DO PCP - General Family Medicine  05/28/16   Legrand Victory LITTIE DOUGLAS, MD Consulting Physician Gastroenterology  03/13/17     Chief Complaint  Patient presents with   UTI concern    2 weeks; Vaginal irritation. Denies burning sensation, urinary frequency. Pt had left over script of Macrobid , ran out a few weeks ago.      Subjective: Carla Beltran is a 58 y.o. Pt presents for an OV with complaints of UTI  at the end of November - treated by marobid. Now she feels she has mild irritation and she is wondering if it could be atrophic vaginitis.   Associated symptoms include mild itching. Patient has a history of chronic recurrent UTI.  She is prescribed Macrobid  postcoital prophylaxis. She does have mild thinning and atrophic vaginitis, uses an over-the-counter product for lubrication.     01/18/2024    2:17 PM 03/11/2023    8:01 AM 08/15/2022    9:54 AM 12/13/2021    8:51 AM 06/12/2021    8:56 AM  Depression screen PHQ 2/9  Decreased Interest 0 0 0 0 0  Down, Depressed, Hopeless 0 0 0 0 0  PHQ - 2 Score 0 0 0 0 0  Altered sleeping 0 0 0 0 0  Tired, decreased energy 0 0 1 0 0  Change in appetite 0 0 0 0 0  Feeling bad or failure about yourself  0 0 0 0 0  Trouble concentrating 0 0 0 0 0  Moving slowly or fidgety/restless 0 0 0 0 0  Suicidal thoughts 0 0 0 0 0  PHQ-9 Score 0 0  1  0  0   Difficult doing work/chores Not difficult at all Not difficult at all Not difficult at all       Data saved with a previous flowsheet row definition    Allergies  Allergen Reactions   Codeine    Social History   Social History Narrative   Married to [place]. Two adult children Estefana and [place] (in college).    Moved from [place]  [facility]  area) 09/2015.    SAHM. College educated.    Drinks one cup of coffee a day.   Takes a daily vitamin.    Wears seatbelts, bicycle helmet and smoke detector in the home.   Tries to exercise routinely.   Feels safe in her relationships.   Past Medical History:  Diagnosis Date   Allergy    Anemia    Anxiety    Depression    Enlarged lymph nodes 06/12/2020   Fatigue    Frequent UTI    Hyperlipidemia    Migraine    Reactive airway disease    patient unaware RAD   Renal stones    Rosacea    Vertigo    was on antivert   Past Surgical History:  Procedure Laterality Date   BREAST BIOPSY  1990   benign   BREAST EXCISIONAL BIOPSY Right 30+ yrs ago   benign   LASIK  2001   TONSILLECTOMY AND ADENOIDECTOMY  1985   WISDOM TOOTH EXTRACTION     Family History  Problem Relation Age of Onset   Diabetes Mother    Mental illness Mother        Hoarder   Dementia Mother    Asthma Mother  Depression Mother    Migraines Mother    Colon polyps Father    Atrial fibrillation Father    Hypertension Father    Diabetes Maternal Aunt    Hearing loss Maternal Aunt    Stroke Maternal Grandmother    Arthritis Maternal Grandfather    Heart disease Maternal Grandfather    Colon polyps Maternal Grandfather    Colon polyps Paternal Grandfather    Colon cancer Neg Hx    Esophageal cancer Neg Hx    Rectal cancer Neg Hx    Stomach cancer Neg Hx    Breast cancer Neg Hx    Allergies as of 01/18/2024       Reactions   Codeine         Medication List        Accurate as of January 18, 2024  3:01 PM. If you have any questions, ask your nurse or doctor.          STOP taking these medications    ezetimibe  10 MG tablet Commonly known as: Zetia  Stopped by: Charlies Bellini       TAKE these medications    Besivance 0.6 % Susp Generic drug: Besifloxacin HCl Apply 1 drop to eye 3 (three) times daily.   escitalopram  20 MG tablet Commonly known as: LEXAPRO  TAKE 1 TABLET BY MOUTH DAILY   ketorolac 0.5 % ophthalmic solution Commonly known as: ACULAR 1 drop 2 (two) times daily.    nitrofurantoin  (macrocrystal-monohydrate) 100 MG capsule Commonly known as: MACROBID  Take 1 capsule (100 mg total) by mouth as needed. 1 capsule after intercourse to prevent UTI   prednisoLONE acetate 1 % ophthalmic suspension Commonly known as: PRED FORTE INSTILL 1 DROP INTO OPERATIVE EYE THREE TIMES DAILY STARTING 3 DAYS PRIOR TO SURGERY AND CONTINUE FOR 1 WEEK AFTER SURGERY, THEN 2 TIMES A DAY FOR 1 WEEK, THEN 1 TIME A DAY FOR 1 WEEK. THEN STOP   VITAMIN D  PO Take 10,000 Units by mouth daily.        All past medical history, surgical history, allergies, family history, immunizations andmedications were updated in the EMR today and reviewed under the history and medication portions of their EMR.     ROS Negative, with the exception of above mentioned in HPI   Objective:  BP 122/80   Pulse 73   Temp 98.3 F (36.8 C)   Wt 158 lb (71.7 kg)   LMP 12/31/2014   SpO2 97%   BMI 26.29 kg/m  Body mass index is 26.29 kg/m. Physical Exam Vitals and nursing note reviewed.  Constitutional:      General: She is not in acute distress.    Appearance: Normal appearance. She is normal weight. She is not ill-appearing or toxic-appearing.  HENT:     Head: Normocephalic and atraumatic.  Eyes:     General: No scleral icterus.       Right eye: No discharge.        Left eye: No discharge.     Extraocular Movements: Extraocular movements intact.     Conjunctiva/sclera: Conjunctivae normal.     Pupils: Pupils are equal, round, and reactive to light.  Cardiovascular:     Rate and Rhythm: Normal rate.  Pulmonary:     Effort: Pulmonary effort is normal.  Abdominal:     General: Abdomen is flat.     Tenderness: There is no abdominal tenderness. There is no guarding or rebound.  Skin:    Findings: No rash.  Neurological:  Mental Status: She is alert and oriented to person, place, and time. Mental status is at baseline.     Motor: No weakness.     Coordination: Coordination normal.      Gait: Gait normal.  Psychiatric:        Mood and Affect: Mood normal.        Behavior: Behavior normal.        Thought Content: Thought content normal.        Judgment: Judgment normal.     No results found. No results found. Results for orders placed or performed in visit on 01/18/24 (from the past 24 hours)  POCT Urinalysis Dipstick (Automated)     Status: Abnormal   Collection Time: 01/18/24  2:25 PM  Result Value Ref Range   Color, UA yellow    Clarity, UA clear    Glucose, UA Negative Negative   Bilirubin, UA negative    Ketones, UA negative    Spec Grav, UA 1.015 1.010 - 1.025   Blood, UA none    pH, UA 6.0 5.0 - 8.0   Protein, UA Negative Negative   Urobilinogen, UA negative (A) 0.2 or 1.0 E.U./dL   Nitrite, UA negative    Leukocytes, UA Negative Negative    Assessment/Plan: Carla Beltran is a 58 y.o. female present for OV for   Frequent UTI (Primary)/abnormal urine - POCT Urinalysis Dipstick (Automated)> point-of-care urine does not appear infectious - Urinalysis w microscopic + reflex cultur> pending Refilled Macrobid  1 tab nightly postcoital  Vaginal irritation - Cervicovaginal ancillary only( [place]) Once results received, patient will be called with results and treatment plan discussed at that time  Reviewed expectations re: course of current medical issues. Discussed self-management of symptoms. Outlined signs and symptoms indicating need for more acute intervention. Patient verbalized understanding and all questions were answered. Patient received an After-Visit Summary.    Orders Placed This Encounter  Procedures   Urinalysis w microscopic + reflex cultur   POCT Urinalysis Dipstick (Automated)   Meds ordered this encounter  Medications   DISCONTD: nitrofurantoin , macrocrystal-monohydrate, (MACROBID ) 100 MG capsule    Sig: Take 1 capsule (100 mg total) by mouth as needed. 1 capsule after intercourse to prevent UTI    Dispense:  90  capsule    Refill:  1   nitrofurantoin , macrocrystal-monohydrate, (MACROBID ) 100 MG capsule    Sig: Take 1 capsule (100 mg total) by mouth as needed. 1 capsule after intercourse to prevent UTI    Dispense:  90 capsule    Refill:  1   Referral Orders  No referral(s) requested today     Note is dictated utilizing voice recognition software. Although note has been proof read prior to signing, occasional typographical errors still can be missed. If any questions arise, please do not hesitate to call for verification.   electronically signed by:  Charlies Bellini, DO  [facility] [place]

## 2024-01-18 NOTE — Patient Instructions (Addendum)

## 2024-01-19 ENCOUNTER — Ambulatory Visit: Payer: Self-pay | Admitting: Family Medicine

## 2024-01-19 LAB — URINALYSIS W MICROSCOPIC + REFLEX CULTURE
Bilirubin Urine: NEGATIVE
Glucose, UA: NEGATIVE
Hgb urine dipstick: NEGATIVE
Ketones, ur: NEGATIVE
Leukocyte Esterase: NEGATIVE
Nitrites, Initial: NEGATIVE
Protein, ur: NEGATIVE
Specific Gravity, Urine: 1.013 (ref 1.001–1.035)
pH: 5.5 (ref 5.0–8.0)

## 2024-01-19 LAB — NO CULTURE INDICATED

## 2024-01-20 LAB — CERVICOVAGINAL ANCILLARY ONLY
Bacterial Vaginitis (gardnerella): NEGATIVE
Candida Glabrata: NEGATIVE
Candida Vaginitis: NEGATIVE
Comment: NEGATIVE
Comment: NEGATIVE
Comment: NEGATIVE

## 2024-01-26 DIAGNOSIS — H2511 Age-related nuclear cataract, right eye: Secondary | ICD-10-CM | POA: Diagnosis not present

## 2024-01-26 DIAGNOSIS — H2512 Age-related nuclear cataract, left eye: Secondary | ICD-10-CM | POA: Diagnosis not present

## 2024-01-26 DIAGNOSIS — H25041 Posterior subcapsular polar age-related cataract, right eye: Secondary | ICD-10-CM | POA: Diagnosis not present

## 2024-01-26 DIAGNOSIS — H25012 Cortical age-related cataract, left eye: Secondary | ICD-10-CM | POA: Diagnosis not present

## 2024-02-02 DIAGNOSIS — H2511 Age-related nuclear cataract, right eye: Secondary | ICD-10-CM | POA: Diagnosis not present

## 2024-02-02 DIAGNOSIS — H25011 Cortical age-related cataract, right eye: Secondary | ICD-10-CM | POA: Diagnosis not present

## 2024-02-04 DIAGNOSIS — H02052 Trichiasis without entropian right lower eyelid: Secondary | ICD-10-CM | POA: Diagnosis not present

## 2024-02-16 DIAGNOSIS — M222X1 Patellofemoral disorders, right knee: Secondary | ICD-10-CM | POA: Diagnosis not present

## 2024-03-04 ENCOUNTER — Telehealth: Payer: Self-pay

## 2024-03-04 NOTE — Telephone Encounter (Signed)
 MyChart message sent regarding flu vaccine .

## 2025-01-16 ENCOUNTER — Encounter (INDEPENDENT_AMBULATORY_CARE_PROVIDER_SITE_OTHER): Admitting: Ophthalmology
